# Patient Record
Sex: Female | Born: 1955 | Race: White | Hispanic: No | Marital: Married | Smoking: Never smoker
Health system: Southern US, Community
[De-identification: ages and names within clinical notes are randomized; demographics above are authoritative.]

## PROBLEM LIST (undated history)

## (undated) DIAGNOSIS — C801 Malignant (primary) neoplasm, unspecified: Secondary | ICD-10-CM

---

## 1991-02-23 HISTORY — PX: BREAST EXCISIONAL BIOPSY: SUR124

## 1997-07-23 ENCOUNTER — Other Ambulatory Visit: Admission: RE | Admit: 1997-07-23 | Discharge: 1997-07-23 | Payer: Self-pay | Admitting: *Deleted

## 2001-07-07 ENCOUNTER — Encounter (INDEPENDENT_AMBULATORY_CARE_PROVIDER_SITE_OTHER): Payer: Self-pay | Admitting: *Deleted

## 2001-07-07 ENCOUNTER — Ambulatory Visit (HOSPITAL_COMMUNITY): Admission: RE | Admit: 2001-07-07 | Discharge: 2001-07-07 | Payer: Self-pay | Admitting: *Deleted

## 2004-05-15 ENCOUNTER — Ambulatory Visit: Payer: Self-pay | Admitting: Family Medicine

## 2005-05-17 ENCOUNTER — Ambulatory Visit: Payer: Self-pay | Admitting: Family Medicine

## 2006-03-11 ENCOUNTER — Ambulatory Visit: Payer: Self-pay | Admitting: Urology

## 2006-05-24 ENCOUNTER — Ambulatory Visit: Payer: Self-pay | Admitting: Family Medicine

## 2006-10-18 ENCOUNTER — Ambulatory Visit: Payer: Self-pay | Admitting: Gastroenterology

## 2007-05-30 ENCOUNTER — Ambulatory Visit: Payer: Self-pay | Admitting: Family Medicine

## 2008-06-05 ENCOUNTER — Ambulatory Visit: Payer: Self-pay | Admitting: Family Medicine

## 2008-07-11 DIAGNOSIS — F32A Depression, unspecified: Secondary | ICD-10-CM | POA: Insufficient documentation

## 2008-08-07 DIAGNOSIS — E559 Vitamin D deficiency, unspecified: Secondary | ICD-10-CM | POA: Insufficient documentation

## 2009-05-08 ENCOUNTER — Emergency Department (HOSPITAL_COMMUNITY): Admission: EM | Admit: 2009-05-08 | Discharge: 2009-05-08 | Payer: Self-pay | Admitting: Emergency Medicine

## 2009-06-09 ENCOUNTER — Ambulatory Visit: Payer: Self-pay | Admitting: Family Medicine

## 2009-09-22 ENCOUNTER — Ambulatory Visit: Payer: Self-pay | Admitting: Internal Medicine

## 2009-10-10 ENCOUNTER — Ambulatory Visit: Payer: Self-pay | Admitting: Internal Medicine

## 2009-10-23 ENCOUNTER — Ambulatory Visit: Payer: Self-pay | Admitting: Internal Medicine

## 2010-05-17 LAB — COMPREHENSIVE METABOLIC PANEL
Calcium: 9.3 mg/dL (ref 8.4–10.5)
Chloride: 103 mEq/L (ref 96–112)
GFR calc Af Amer: >60 mL/min (ref >60)
Potassium: 4.1 mEq/L (ref 3.5–5.1)

## 2010-05-17 LAB — POCT CARDIAC MARKERS
CKMB, poc: <1 ng/mL — ABNORMAL LOW (ref 1.0–8.0)
Myoglobin, poc: 71.3 ng/mL (ref 12–200)
Troponin i, poc: <0.05 ng/mL (ref 0.00–0.09)

## 2010-05-17 LAB — DIFFERENTIAL
Basophils Absolute: 0 K/uL (ref 0.0–0.1)
Basophils Relative: 0 % (ref 0–1)
Eosinophils Absolute: 0.1 10*3/uL (ref 0.0–0.7)
Eosinophils Relative: 2 % (ref 0–5)
Lymphocytes Relative: 43 % (ref 12–46)
Lymphs Abs: 2.1 K/uL (ref 0.7–4.0)
Monocytes Absolute: 0.3 10*3/uL (ref 0.1–1.0)
Monocytes Relative: 7 % (ref 3–12)
Neutro Abs: 2.3 10*3/uL (ref 1.7–7.7)
Neutrophils Relative %: 48 % (ref 43–77)

## 2010-05-17 LAB — CBC
HCT: 44 % (ref 36.0–46.0)
Hemoglobin: 14.6 g/dL (ref 12.0–15.0)
MCHC: 33.2 g/dL (ref 30.0–36.0)
MCV: 91.6 fL (ref 78.0–100.0)
Platelets: 214 K/uL (ref 150–400)
RBC: 4.8 MIL/uL (ref 3.87–5.11)
RDW: 12.4 % (ref 11.5–15.5)
WBC: 4.8 K/uL (ref 4.0–10.5)

## 2010-05-17 LAB — COMPREHENSIVE METABOLIC PANEL WITH GFR
ALT: 68 U/L — ABNORMAL HIGH (ref 0–35)
AST: 43 U/L — ABNORMAL HIGH (ref 0–37)
Albumin: 4 g/dL (ref 3.5–5.2)
Alkaline Phosphatase: 64 U/L (ref 39–117)
BUN: 12 mg/dL (ref 6–23)
CO2: 31 meq/L (ref 19–32)
Creatinine, Ser: 0.92 mg/dL (ref 0.4–1.2)
GFR calc non Af Amer: >60 mL/min (ref >60)
Glucose, Bld: 94 mg/dL (ref 70–99)
Sodium: 140 meq/L (ref 135–145)
Total Bilirubin: 1.3 mg/dL — ABNORMAL HIGH (ref 0.3–1.2)
Total Protein: 7.1 g/dL (ref 6.0–8.3)

## 2010-05-17 LAB — D-DIMER, QUANTITATIVE: D-Dimer, Quant: 0.43 ug{FEU}/mL (ref 0.00–0.48)

## 2010-06-11 ENCOUNTER — Ambulatory Visit: Payer: Self-pay | Admitting: Family Medicine

## 2010-07-10 NOTE — Op Note (Signed)
[facility]  Patient:    Jasmin Hester, Jasmin Hester Visit Number: [id number] MRN: [id number]          Service Type: DSU Location: DAY Attending Physician:  Marin Comment Dictated by:   Pershing Cox, M.D. Proc. Date: 07/07/01 Admit Date:  07/07/2001 Discharge Date: 07/07/2001                             Operative Report  PREOPERATIVE DIAGNOSES:  Postcoital vaginal bleeding and abnormal hydrosonogram.  POSTOPERATIVE DIAGNOSES: 1. Normal endometrial canal. 2. No filling defects. 3. Lush endometrium.  ANESTHESIA:  MAC plus Marcaine paracervical block.  SURGEON:  Pershing Cox, M.D.  INDICATIONS FOR PROCEDURE:  Zoi Devine is a 55 year old, gravida 2, para 2, female, who has been having heavy menstrual periods with clots that occur especially the first day of her menstrual period.  She has also had two episodes of postcoital bleeding which led to a hydrosonogram.  This hydrosonogram showed thickened anterior and posterior endometrial walls with a question of hyperechoic mass on the posterior wall.  The patient is brought to the operating room today for evaluation of her endometrium and possible resection.  OPERATIVE FINDINGS:  Uterine cavity sounded to 8.5 cm.  The uterus was small, anteflexed, and there were no adnexal masses.  The endometrial cavity showed lush endometrium covering all of the endometrial surfaces.  No atypical vascularity or ostia were visualized.  At the end of the procedure, the cavity was also visualized and photographed.  There was no suggestion of submucosal myoma or polyp.  DESCRIPTION OF PROCEDURE:  Devan Babino was brought to the operating room with an IV in place.  Because of penicillin allergy, she received 400 mg of ciprofloxacin for preoperative antibiotic.  She was placed supine on the OR table, and IV sedation was administered.  She was then placed into Allen stirrups, and exam under anesthesia was  performed.  Hibiclens was used to cleanse the vagina, perineum, and upper thighs.  Sterile linens were used to drape for a vaginal procedure.  This included a collecting drape beneath her buttocks for our effluent.  Weight vaginal speculum was inserted into the vagina.  The cervix was injected on the anterior surface with 0.25% Marcaine.  It was then grasped with a single-tooth tenaculum and Marcaine paracervical block was administered at the 3, 4, 7, and 8 position.  Kevorkian curette was used to obtain endocervical curettings.  The uterine sound then passed to the depth of the fundus approximately 8.5 cm.  Serial Pratt dilators were used to dilate the cervix to size 25.  Diagnostic scope was introduced and using through-and-through irrigation with a pressure setting of 100 and sorbitol irrigation, we were able to visualize the endometrial cavity, and photographs were taken to document our findings.  The hysteroscope was removed, and the cervix was dilated to size 29 Pratt dilator to admit a small sharp curette.  The walls of the endometrium were vigorously curetted, and all of the collections were submitted as endometrial curettings.  Polyp forceps were introduced to obtain any other material that was able to be retrieved.  Hysteroscope was then reintroduced into the cavity, and the cavity was distended again and visualized.  There was no evidence of submucosal myoma or uterine polyp.  The patient was taken to the recovery room in excellent condition. Dictated by:   Pershing Cox, M.D. Attending Physician:  Marin Comment DD:  07/07/01 TD:  07/09/01 Job: [id number]

## 2010-07-23 ENCOUNTER — Ambulatory Visit: Payer: Self-pay

## 2010-07-30 ENCOUNTER — Ambulatory Visit: Payer: Self-pay

## 2011-06-21 ENCOUNTER — Ambulatory Visit: Payer: Self-pay | Admitting: Family Medicine

## 2012-06-22 ENCOUNTER — Ambulatory Visit: Payer: Self-pay | Admitting: Family Medicine

## 2013-06-25 ENCOUNTER — Ambulatory Visit: Payer: Self-pay | Admitting: Family Medicine

## 2013-10-15 LAB — HEMOGLOBIN A1C: HEMOGLOBIN A1C: 5.4

## 2013-12-05 ENCOUNTER — Ambulatory Visit: Payer: Self-pay | Admitting: Family Medicine

## 2013-12-05 LAB — HEPATIC FUNCTION PANEL
ALT: 15 U/L (ref 7–35)
AST: 19 U/L (ref 13–35)

## 2013-12-05 LAB — CBC AND DIFFERENTIAL
HCT: 39 % (ref 36–46)
Hemoglobin: 13.4 g/dL (ref 12.0–16.0)
Platelets: 215 10*3/uL (ref 150–399)
WBC: 8.4 10*3/mL

## 2013-12-05 LAB — BASIC METABOLIC PANEL
BUN: 11 mg/dL (ref 4–21)
Creatinine: 0.8 mg/dL (ref 0.5–1.1)
POTASSIUM: 4.3 mmol/L (ref 3.4–5.3)
SODIUM: 141 mmol/L (ref 137–147)

## 2013-12-27 ENCOUNTER — Ambulatory Visit: Payer: Self-pay | Admitting: Gastroenterology

## 2013-12-27 LAB — HM COLONOSCOPY: HM COLON: NORMAL

## 2014-01-23 LAB — HM PAP SMEAR: HM PAP: NEGATIVE

## 2014-06-10 ENCOUNTER — Other Ambulatory Visit: Payer: Self-pay | Admitting: Family Medicine

## 2014-06-10 DIAGNOSIS — Z1231 Encounter for screening mammogram for malignant neoplasm of breast: Secondary | ICD-10-CM

## 2014-06-27 ENCOUNTER — Ambulatory Visit
Admission: RE | Admit: 2014-06-27 | Discharge: 2014-06-27 | Disposition: A | Discharge status: Home / Self Care | Payer: 59 | Source: Ambulatory Visit | Attending: Family Medicine | Admitting: Family Medicine

## 2014-06-27 DIAGNOSIS — Z1231 Encounter for screening mammogram for malignant neoplasm of breast: Secondary | ICD-10-CM | POA: Insufficient documentation

## 2014-06-27 HISTORY — DX: Malignant (primary) neoplasm, unspecified: C80.1

## 2014-06-27 LAB — HM MAMMOGRAPHY

## 2014-09-11 ENCOUNTER — Other Ambulatory Visit: Payer: Self-pay | Admitting: Family Medicine

## 2014-09-11 DIAGNOSIS — Z79890 Hormone replacement therapy (postmenopausal): Secondary | ICD-10-CM

## 2014-09-12 DIAGNOSIS — Z79890 Hormone replacement therapy (postmenopausal): Secondary | ICD-10-CM | POA: Insufficient documentation

## 2014-09-12 NOTE — Telephone Encounter (Signed)
Last OV 01/2014  Thanks,   -Mickel Baas

## 2014-12-09 ENCOUNTER — Encounter: Payer: Self-pay | Admitting: Family Medicine

## 2014-12-09 ENCOUNTER — Ambulatory Visit (INDEPENDENT_AMBULATORY_CARE_PROVIDER_SITE_OTHER): Payer: 59 | Admitting: Family Medicine

## 2014-12-09 VITALS — BP 108/82 | HR 68 | Temp 98.4°F | Resp 16 | Ht 63.0 in | Wt 127.0 lb

## 2014-12-09 DIAGNOSIS — M1990 Unspecified osteoarthritis, unspecified site: Secondary | ICD-10-CM | POA: Insufficient documentation

## 2014-12-09 DIAGNOSIS — J069 Acute upper respiratory infection, unspecified: Secondary | ICD-10-CM

## 2014-12-09 DIAGNOSIS — R011 Cardiac murmur, unspecified: Secondary | ICD-10-CM | POA: Insufficient documentation

## 2014-12-09 DIAGNOSIS — D709 Neutropenia, unspecified: Secondary | ICD-10-CM | POA: Insufficient documentation

## 2014-12-09 DIAGNOSIS — N951 Menopausal and female climacteric states: Secondary | ICD-10-CM | POA: Insufficient documentation

## 2014-12-09 DIAGNOSIS — Z87898 Personal history of other specified conditions: Secondary | ICD-10-CM | POA: Insufficient documentation

## 2014-12-09 DIAGNOSIS — N393 Stress incontinence (female) (male): Secondary | ICD-10-CM | POA: Insufficient documentation

## 2014-12-09 MED ORDER — BENZONATATE 100 MG PO CAPS: 100.0000 mg | ORAL_CAPSULE | Freq: Three times a day (TID) | ORAL | Status: DC | PRN

## 2014-12-09 NOTE — Progress Notes (Signed)
Subjective:    Patient ID: Jasmin Hester, female    DOB: 21-May-1955, 59 y.o.   MRN: [id number]  Cough This is a new problem. The current episode started 1 to 4 weeks ago (x 1 week). The problem has been gradually improving. The cough is non-productive. Associated symptoms include chest pain, chills, ear congestion (right), headaches and nasal congestion. Pertinent negatives include no ear pain, fever, heartburn, hemoptysis, myalgias, postnasal drip, rash, rhinorrhea, sore throat, shortness of breath, sweats, weight loss or wheezing. She has tried OTC cough suppressant (Mucinex) for the symptoms. The treatment provided mild relief. Her past medical history is significant for pneumonia (30 years ago per pt). There is no history of asthma or COPD.      Review of Systems  Constitutional: Positive for chills. Negative for fever and weight loss.  HENT: Negative for ear pain, postnasal drip, rhinorrhea and sore throat.   Respiratory: Positive for cough. Negative for hemoptysis, shortness of breath and wheezing.   Cardiovascular: Positive for chest pain.  Gastrointestinal: Negative for heartburn.  Musculoskeletal: Negative for myalgias.  Skin: Negative for rash.  Neurological: Positive for headaches.   BP 108/82 mmHg  Pulse 68  Temp(Src) 98.4 F (36.9 C) (Oral)  Resp 16  Ht 5\' 3"  (1.6 m)  Wt 127 lb (57.607 kg)  BMI 22.50 kg/m2  SpO2 97%   Patient Active Problem List   Diagnosis Date Noted  . Arthritis 12/09/2014  . Cardiac murmur 12/09/2014  . H/O disease 12/09/2014  . Neutropenia ([place]) 12/09/2014  . Female climacteric state 12/09/2014  . Stress incontinence 12/09/2014  . Postmenopausal HRT (hormone replacement therapy) 09/12/2014  . Abnormal LFTs 05/12/2009  . Avitaminosis D 08/07/2008  . Idiopathic scoliosis 07/23/2008  . Clinical depression 07/11/2008  . H/O Malignant melanoma 07/11/2008  . Restless leg 07/11/2008   Past Medical History  Diagnosis Date  . Cancer [facility])      melanoma 2007   Current Outpatient Prescriptions on File Prior to Visit  Medication Sig  . estradiol (ESTRACE) 0.5 MG tablet Take 1 tablet by mouth  every day   No current facility-administered medications on file prior to visit.   Allergies  Allergen Reactions  . Penicillins    Past Surgical History  Procedure Laterality Date  . Breast biopsy Left     twice negative results  . Breast biopsy Right     one neg   Social History   Social History  . Marital Status: Married    Spouse Name: N/A  . Number of Children: N/A  . Years of Education: N/A   Occupational History  . Not on file.   Social History Main Topics  . Smoking status: Never Smoker   . Smokeless tobacco: Never Used  . Alcohol Use: Yes     Comment: occasional  . Drug Use: No  . Sexual Activity: Not on file   Other Topics Concern  . Not on file   Social History Narrative   Family History  Problem Relation Age of Onset  . Breast cancer Maternal Aunt 40    3 aunts      .result     Objective:   Physical Exam  Constitutional: She appears well-developed and well-nourished.  HENT:  Head: Normocephalic and atraumatic.  Right Ear: External ear normal.  Left Ear: External ear normal.  Nose: Nose normal.  Mouth/Throat: Oropharynx is clear and moist.  Cardiovascular: Normal rate and regular rhythm.   Pulmonary/Chest: Effort normal and breath sounds normal.  Psychiatric: She has a normal mood and affect. Her behavior is normal.          Assessment & Plan:  1. Upper respiratory infection Advised pt that this is viral; call if sx worsen for abx. Take Tessalon Perles as below for sx. Continue OTC Mucinex. - benzonatate (TESSALON) 100 MG capsule; Take 1 capsule (100 mg total) by mouth 3 (three) times daily as needed for cough.  Dispense: 30 capsule; Refill: 0  Patient seen and examined by Jerrell Belfast, MD, and note scribed by Renaldo Fiddler, CMA. I have reviewed the document for accuracy and  completeness and I agree with above. Jerrell Belfast, MD   Margarita Rana, MD

## 2014-12-09 NOTE — Patient Instructions (Signed)
If your symptoms worsen, call office at [phone] to request antibiotics. Try lemon and honey at home for cough. Take Mucinex and Tessalon Perles as prescribed.

## 2015-02-18 ENCOUNTER — Encounter: Payer: Self-pay | Admitting: Family Medicine

## 2015-02-18 ENCOUNTER — Ambulatory Visit (INDEPENDENT_AMBULATORY_CARE_PROVIDER_SITE_OTHER): Payer: 59 | Admitting: Family Medicine

## 2015-02-18 VITALS — BP 108/72 | HR 60 | Temp 97.6°F | Resp 16 | Ht 63.0 in | Wt 126.0 lb

## 2015-02-18 DIAGNOSIS — Z126 Encounter for screening for malignant neoplasm of bladder: Secondary | ICD-10-CM

## 2015-02-18 DIAGNOSIS — R42 Dizziness and giddiness: Secondary | ICD-10-CM

## 2015-02-18 DIAGNOSIS — E559 Vitamin D deficiency, unspecified: Secondary | ICD-10-CM | POA: Diagnosis not present

## 2015-02-18 DIAGNOSIS — Z0000 Encounter for general adult medical examination without abnormal findings: Secondary | ICD-10-CM

## 2015-02-18 LAB — POCT URINALYSIS DIPSTICK
Bilirubin, UA: NEGATIVE
Blood, UA: NEGATIVE
Glucose, UA: NEGATIVE
KETONES UA: NEGATIVE
LEUKOCYTES UA: NEGATIVE
NITRITE UA: NEGATIVE
Protein, UA: NEGATIVE
Spec Grav, UA: <=1.005
Urobilinogen, UA: 0.2
pH, UA: 6

## 2015-02-18 NOTE — Progress Notes (Signed)
Patient: Jasmin Hester, Female    DOB: August 02, 1955, 59 y.o.   MRN: [id number] Visit Date: 02/18/2015  Today's Provider: Margarita Rana, MD   Chief Complaint  Patient presents with  . Annual Exam   Subjective:    Annual physical exam Jasmin Hester is a 59 y.o. female who presents today for health maintenance and complete physical. She feels well. She reports exercising on the treadmill x 30 minutes three days a week. She reports she is sleeping well.  Last CPE- 01/23/2014 Last Mammo- 06/27/2014- BI-RADS 1 Last Pap- 01/23/2014. Negative, HPV negative Last Colonoscopy- 12/27/2013- WNL -----------------------------------------------------------------   Review of Systems  Genitourinary: Positive for urgency and enuresis.  Musculoskeletal: Positive for back pain.  Neurological: Positive for dizziness.  Hematological: Bruises/bleeds easily.    Social History      She  reports that she has never smoked. She has never used smokeless tobacco. She reports that she drinks alcohol. She reports that she does not use illicit drugs.       Social History   Social History  . Marital Status: Married    Spouse Name: Herbie [place]  . Number of Children: 2  . Years of Education: Bachelor's   Social History Main Topics  . Smoking status: Never Smoker   . Smokeless tobacco: Never Used  . Alcohol Use: Yes     Comment: occasional  . Drug Use: No  . Sexual Activity: Not Asked   Other Topics Concern  . None   Social History Narrative    Patient Active Problem List   Diagnosis Date Noted  . Arthritis 12/09/2014  . Cardiac murmur 12/09/2014  . H/O disease 12/09/2014  . Neutropenia ([place]) 12/09/2014  . Female climacteric state 12/09/2014  . Stress incontinence 12/09/2014  . Postmenopausal HRT (hormone replacement therapy) 09/12/2014  . Abnormal LFTs 05/12/2009  . Avitaminosis D 08/07/2008  . Idiopathic scoliosis 07/23/2008  . Clinical depression 07/11/2008  . H/O Malignant  melanoma 07/11/2008  . Restless leg 07/11/2008    Past Surgical History  Procedure Laterality Date  . Breast biopsy Left     twice negative results  . Breast biopsy Right     one neg    Family History        Family Status  Relation Status Death Age  . Maternal Aunt Alive   . Mother Alive   . Father Deceased 57  . Brother Deceased   . Daughter Alive   . Son Alive   . Brother Alive         Her family history includes Arthritis in her mother; Asthma in her brother; Breast cancer (age of onset: 23) in her maternal aunt; CAD in her mother; Cancer in her brother and maternal aunt; Diabetes in her father; Osteoporosis in her mother; Parkinson's disease in her father.    Allergies  Allergen Reactions  . Penicillins     Previous Medications   CHOLECALCIFEROL (VITAMIN D) 1000 UNITS TABLET    Take 1 tablet by mouth daily.   DOXYLAMINE SUCCINATE, SLEEP, (SLEEP AID PO)    Take by mouth.   ESTRADIOL (ESTRACE) 0.5 MG TABLET    Take 1 tablet by mouth  every day   MULTIPLE VITAMIN PO    Take 1 tablet by mouth daily.   TOLTERODINE (DETROL LA) 4 MG 24 HR CAPSULE    Take 1 capsule by mouth daily.    Patient Care Team: Margarita Rana, MD as PCP - General (Family  Medicine)     Objective:   Vitals: BP 108/72 mmHg  Pulse 60  Temp(Src) 97.6 F (36.4 C) (Oral)  Resp 16  Ht 5\' 3"  (1.6 m)  Wt 126 lb (57.153 kg)  BMI 22.33 kg/m2   Physical Exam  Constitutional: She is oriented to person, place, and time. She appears well-developed and well-nourished.  HENT:  Head: Normocephalic and atraumatic.  Right Ear: Tympanic membrane, external ear and ear canal normal.  Left Ear: Tympanic membrane, external ear and ear canal normal.  Nose: Nose normal.  Mouth/Throat: Uvula is midline, oropharynx is clear and moist and mucous membranes are normal.  Eyes: Conjunctivae, EOM and lids are normal. Pupils are equal, round, and reactive to light. Right eye exhibits normal extraocular motion and no  nystagmus. Left eye exhibits normal extraocular motion and no nystagmus.  Neck: Trachea normal and normal range of motion. Neck supple. Carotid bruit is not present. No thyroid mass and no thyromegaly present.  Cardiovascular: Normal rate, regular rhythm and normal heart sounds.   Pulmonary/Chest: Effort normal and breath sounds normal.  Abdominal: Soft. Normal appearance and bowel sounds are normal. There is no hepatosplenomegaly. There is no tenderness.  Genitourinary: No breast swelling, tenderness or discharge.  Musculoskeletal: Normal range of motion.  Lymphadenopathy:    She has no cervical adenopathy.    She has no axillary adenopathy.  Neurological: She is alert and oriented to person, place, and time. She has normal strength. No cranial nerve deficit.  Skin: Skin is warm, dry and intact.  Psychiatric: She has a normal mood and affect. Her speech is normal and behavior is normal. Judgment and thought content normal. Cognition and memory are normal.     Depression Screen PHQ 2/9 Scores 02/18/2015  PHQ - 2 Score 0      Assessment & Plan:     Routine Health Maintenance and Physical Exam  Exercise Activities and Dietary recommendations Goals    None      Immunization History  Administered Date(s) Administered  . Tdap 04/22/2006    Health Maintenance  Topic Date Due  . Hepatitis C Screening  09/06/1955  . HIV Screening  07/21/1970  . INFLUENZA VACCINE  11/23/2015 (Originally 09/23/2014)  . TETANUS/TDAP  04/21/2016  . MAMMOGRAM  06/26/2016  . PAP SMEAR  01/23/2017  . COLONOSCOPY  12/28/2023      Discussed health benefits of physical activity, and encouraged her to engage in regular exercise appropriate for her age and condition.   1. Annual physical exam As above.   2. Bladder cancer screening Stable.  - POCT urinalysis dipstick Results for orders placed or performed in visit on 02/18/15  POCT urinalysis dipstick  Result Value Ref Range   Color, UA straw     Clarity, UA clear    Glucose, UA Negative    Bilirubin, UA Negative    Ketones, UA Negative    Spec Grav, UA <=1.005    Blood, UA Negative    pH, UA 6.0    Protein, UA Negative    Urobilinogen, UA 0.2    Nitrite, UA Negative    Leukocytes, UA Negative Negative    3. Dizziness New problem. Will check labs.  - CBC with Differential/Platelet - Comprehensive metabolic panel - TSH  4. Avitaminosis D - VITAMIN D 25 Hydroxy (Vit-D Deficiency, Fractures)   Patient was seen and examined by Jerrell Belfast, MD, and note scribed by Renaldo Fiddler, CMA. I have reviewed the document for accuracy and completeness  and I agree with above. Jerrell Belfast, MD   Margarita Rana, MD    --------------------------------------------------------------------

## 2015-02-19 LAB — CBC WITH DIFFERENTIAL/PLATELET
BASOS ABS: 0 10*3/uL (ref 0.0–0.2)
BASOS: 0 %
EOS (ABSOLUTE): 0 10*3/uL (ref 0.0–0.4)
EOS: 1 %
Hematocrit: 42.4 % (ref 34.0–46.6)
Hemoglobin: 14.2 g/dL (ref 11.1–15.9)
IMMATURE GRANS (ABS): 0 10*3/uL (ref 0.0–0.1)
IMMATURE GRANULOCYTES: 0 %
LYMPHS: 36 %
Lymphocytes Absolute: 2 10*3/uL (ref 0.7–3.1)
MCH: 29.6 pg (ref 26.6–33.0)
MCHC: 33.5 g/dL (ref 31.5–35.7)
MCV: 88 fL (ref 79–97)
Monocytes Absolute: 0.5 10*3/uL (ref 0.1–0.9)
Monocytes: 9 %
NEUTROS PCT: 54 %
Neutrophils Absolute: 3 10*3/uL (ref 1.4–7.0)
PLATELETS: 242 10*3/uL (ref 150–379)
RBC: 4.8 x10E6/uL (ref 3.77–5.28)
RDW: 12.9 % (ref 12.3–15.4)
WBC: 5.5 10*3/uL (ref 3.4–10.8)

## 2015-02-19 LAB — COMPREHENSIVE METABOLIC PANEL
ALT: 13 IU/L (ref 0–32)
AST: 20 IU/L (ref 0–40)
Albumin/Globulin Ratio: 1.7 (ref 1.1–2.5)
Albumin: 4.3 g/dL (ref 3.5–5.5)
Alkaline Phosphatase: 63 IU/L (ref 39–117)
BUN/Creatinine Ratio: 14 (ref 9–23)
BUN: 12 mg/dL (ref 6–24)
Bilirubin Total: 1.4 mg/dL — ABNORMAL HIGH (ref 0.0–1.2)
CALCIUM: 9.5 mg/dL (ref 8.7–10.2)
CO2: 26 mmol/L (ref 18–29)
CREATININE: 0.87 mg/dL (ref 0.57–1.00)
Chloride: 99 mmol/L (ref 96–106)
GFR, EST AFRICAN AMERICAN: 84 mL/min/{1.73_m2} (ref >59)
GFR, EST NON AFRICAN AMERICAN: 73 mL/min/{1.73_m2} (ref >59)
GLUCOSE: 76 mg/dL (ref 65–99)
Globulin, Total: 2.6 g/dL (ref 1.5–4.5)
Potassium: 4.3 mmol/L (ref 3.5–5.2)
Sodium: 142 mmol/L (ref 134–144)
TOTAL PROTEIN: 6.9 g/dL (ref 6.0–8.5)

## 2015-02-19 LAB — VITAMIN D 25 HYDROXY (VIT D DEFICIENCY, FRACTURES): Vit D, 25-Hydroxy: 38.2 ng/mL (ref 30.0–100.0)

## 2015-02-19 LAB — TSH: TSH: 1.29 u[IU]/mL (ref 0.450–4.500)

## 2015-02-19 NOTE — Progress Notes (Signed)
Patient advised as below. sd 

## 2015-04-12 ENCOUNTER — Other Ambulatory Visit: Payer: Self-pay | Admitting: Family Medicine

## 2015-04-12 DIAGNOSIS — N393 Stress incontinence (female) (male): Secondary | ICD-10-CM

## 2015-05-20 ENCOUNTER — Other Ambulatory Visit: Payer: Self-pay | Admitting: Family Medicine

## 2015-05-20 DIAGNOSIS — Z1231 Encounter for screening mammogram for malignant neoplasm of breast: Secondary | ICD-10-CM

## 2015-06-30 ENCOUNTER — Ambulatory Visit
Admission: RE | Admit: 2015-06-30 | Discharge: 2015-06-30 | Disposition: A | Discharge status: Home / Self Care | Payer: 59 | Source: Ambulatory Visit | Attending: Family Medicine | Admitting: Family Medicine

## 2015-06-30 DIAGNOSIS — Z1231 Encounter for screening mammogram for malignant neoplasm of breast: Secondary | ICD-10-CM | POA: Diagnosis not present

## 2015-10-18 ENCOUNTER — Other Ambulatory Visit: Payer: Self-pay | Admitting: Family Medicine

## 2015-10-18 DIAGNOSIS — Z79890 Hormone replacement therapy (postmenopausal): Secondary | ICD-10-CM

## 2015-10-30 ENCOUNTER — Other Ambulatory Visit: Payer: Self-pay | Admitting: Family Medicine

## 2015-10-30 DIAGNOSIS — N393 Stress incontinence (female) (male): Secondary | ICD-10-CM

## 2015-11-04 NOTE — Telephone Encounter (Signed)
Dr Alben Spittle patient, please review-aa

## 2015-12-11 ENCOUNTER — Encounter: Payer: Self-pay | Admitting: Family Medicine

## 2015-12-11 ENCOUNTER — Ambulatory Visit (INDEPENDENT_AMBULATORY_CARE_PROVIDER_SITE_OTHER): Payer: 59 | Admitting: Family Medicine

## 2015-12-11 VITALS — BP 104/62 | HR 64 | Temp 98.0°F | Resp 14 | Ht 62.25 in | Wt 127.0 lb

## 2015-12-11 DIAGNOSIS — Z124 Encounter for screening for malignant neoplasm of cervix: Secondary | ICD-10-CM

## 2015-12-11 DIAGNOSIS — Z0000 Encounter for general adult medical examination without abnormal findings: Secondary | ICD-10-CM | POA: Diagnosis not present

## 2015-12-11 DIAGNOSIS — Z1211 Encounter for screening for malignant neoplasm of colon: Secondary | ICD-10-CM | POA: Diagnosis not present

## 2015-12-11 LAB — POCT URINALYSIS DIPSTICK
BILIRUBIN UA: NEGATIVE
Blood, UA: NEGATIVE
Glucose, UA: NEGATIVE
KETONES UA: NEGATIVE
Nitrite, UA: NEGATIVE
PH UA: 5
Protein, UA: NEGATIVE
SPEC GRAV UA: 1.01
Urobilinogen, UA: 0.2

## 2015-12-11 LAB — IFOBT (OCCULT BLOOD): IFOBT: NEGATIVE

## 2015-12-11 NOTE — Progress Notes (Signed)
Patient: Jasmin Hester, Female    DOB: [birth year], 60 y.o.   MRN: [id number] Visit Date: 12/11/2015  Today's Provider: Wilhemena Durie, MD   Chief Complaint  Patient presents with  . Annual Exam   Subjective:  Jasmin Hester is a 60 y.o. female who presents today for health maintenance and complete physical. She feels well. She reports exercising about 4 to 5 times a week for about 30 minutes. She reports she is sleeping well.She drinks about 4 beers per week, no excessive consumption. Immunization History  Administered Date(s) Administered  . Tdap 04/22/2006   Last Mammogram 06/30/15 normal  Colonoscopy 12/27/13 normal repeat 10 years  Pap smear 01/23/14 normal and HPV negative.-she had hysterectomy but still has cervix. Depression screen [facility] 2/9 12/11/2015 02/18/2015  Decreased Interest 2 0  Down, Depressed, Hopeless 1 0  PHQ - 2 Score 3 0  Altered sleeping 0 -  Tired, decreased energy 2 -  Change in appetite 0 -  Feeling bad or failure about yourself  1 -  Trouble concentrating 1 -  Moving slowly or fidgety/restless 0 -  Suicidal thoughts 0 -  PHQ-9 Score 7 -  Difficult doing work/chores Somewhat difficult -    Review of Systems  Constitutional: Positive for fatigue.  HENT: Negative.   Eyes: Negative.   Respiratory: Negative.   Cardiovascular: Negative.   Gastrointestinal: Negative.   Endocrine: Negative.   Genitourinary: Positive for enuresis and urgency.  Musculoskeletal: Positive for arthralgias and back pain.  Skin: Negative.   Allergic/Immunologic: Negative.   Neurological: Negative.   Hematological: Negative.   Psychiatric/Behavioral: Positive for dysphoric mood. The patient is nervous/anxious.     Social History   Social History  . Marital status: Married    Spouse name: Herbie Baltimore  . Number of children: 2  . Years of education: Bachelor's   Occupational History  . Not on file.   Social History Main Topics  . Smoking status: Never Smoker  . Smokeless  tobacco: Never Used  . Alcohol use Yes     Comment: occasional  . Drug use: No  . Sexual activity: Not on file   Other Topics Concern  . Not on file   Social History Narrative  . No narrative on file    Patient Active Problem List   Diagnosis Date Noted  . Arthritis 12/09/2014  . Cardiac murmur 12/09/2014  . H/O disease 12/09/2014  . Neutropenia ([place]) 12/09/2014  . Female climacteric state 12/09/2014  . Stress incontinence 12/09/2014  . Postmenopausal HRT (hormone replacement therapy) 09/12/2014  . Abnormal LFTs 05/12/2009  . Avitaminosis D 08/07/2008  . Idiopathic scoliosis 07/23/2008  . Clinical depression 07/11/2008  . H/O Malignant melanoma 07/11/2008  . Restless leg 07/11/2008    Past Surgical History:  Procedure Laterality Date  . BREAST EXCISIONAL BIOPSY Left 1993    negative results  . BREAST EXCISIONAL BIOPSY Right [id number]    neg    Her family history includes Arthritis in her mother; Asthma in her brother; Breast cancer (age of onset: 37) in her maternal aunt; CAD in her mother; Cancer in her brother and maternal aunt; Diabetes in her father; Osteoporosis in her mother; Parkinson's disease in her father.    Outpatient Encounter Prescriptions as of 12/11/2015  Medication Sig Note  . estradiol (ESTRACE) 0.5 MG tablet Take 1 tablet by mouth  every day   . MULTIPLE VITAMIN PO Take 1 tablet by mouth daily. 12/09/2014: Received from: [place]:  Take by mouth.  . tolterodine (DETROL LA) 4 MG 24 hr capsule Take 1 capsule by mouth  daily   . [DISCONTINUED] cholecalciferol (VITAMIN D) 1000 UNITS tablet Take 1 tablet by mouth daily. 12/09/2014: Received from: [place]: Take by mouth.  . [DISCONTINUED] Doxylamine Succinate, Sleep, (SLEEP AID PO) Take by mouth.    No facility-administered encounter medications on file as of 12/11/2015.     Patient Care Team: Jerrol Banana., MD as PCP - General  (Family Medicine)     Objective:   Vitals:  Vitals:   12/11/15 0941  BP: 104/62  Pulse: 64  Resp: 14  Temp: 98 F (36.7 C)  Weight: 127 lb (57.6 kg)  Height: 5' 2.25" (1.581 m)    Physical Exam  Constitutional: She is oriented to person, place, and time. She appears well-developed and well-nourished.  HENT:  Head: Normocephalic and atraumatic.  Right Ear: External ear normal.  Left Ear: External ear normal.  Nose: Nose normal.  Mouth/Throat: Oropharynx is clear and moist.  Eyes: Conjunctivae are normal. No scleral icterus.  Neck: Neck supple. No thyromegaly present.  Cardiovascular: Normal rate, regular rhythm, normal heart sounds and intact distal pulses.   Pulmonary/Chest: Effort normal and breath sounds normal.  Abdominal: Soft.  Musculoskeletal: She exhibits no edema.  Lymphadenopathy:    She has no cervical adenopathy.  Neurological: She is alert and oriented to person, place, and time. No cranial nerve deficit. She exhibits normal muscle tone. Coordination normal.  Skin: Skin is warm and dry.  Fair skin and red hair.  Psychiatric: She has a normal mood and affect. Her behavior is normal. Judgment and thought content normal.     Depression Screen PHQ 2/9 Scores 02/18/2015  PHQ - 2 Score 0    PHQ9 is 9.  Assessment & Plan:    1. Annual physical exam - POCT urinalysis dipstick - CBC w/Diff/Platelet - Comprehensive metabolic panel - Lipid Panel With LDL/HDL Ratio - TSH Consider zoster. 2. Pap smear for cervical cancer screening - Pap IG and HPV (high risk) DNA detection  3. Colon cancer screening - IFOBT POC (occult bld, rslt in office) 4. Adjustment reaction Will re check in 6 months. May need to start depression medication. Sertraline if needed.  HP I, Exam and A&P transcribed under direction and in the presence of Miguel Aschoff, MD. I have done the exam and reviewed the chart and it is accurate to the best of my knowledge. Miguel Aschoff  M.D. [facility]

## 2015-12-17 LAB — PAP IG AND HPV HIGH-RISK
HPV, high-risk: NEGATIVE
PAP Smear Comment: 0

## 2015-12-18 ENCOUNTER — Telehealth: Payer: Self-pay

## 2015-12-18 NOTE — Telephone Encounter (Signed)
-----   Message from Jerrol Banana., MD sent at 12/18/2015  2:32 AM EDT ----- Pap normal.

## 2015-12-18 NOTE — Telephone Encounter (Signed)
LMTCB 12/18/2015  Thanks,   -Mickel Baas

## 2015-12-18 NOTE — Telephone Encounter (Signed)
Pt advised.   Thanks,   -[place]  

## 2015-12-22 ENCOUNTER — Telehealth: Payer: Self-pay

## 2015-12-22 NOTE — Telephone Encounter (Signed)
Patient is requesting to start sertraline. [facility]  [place] [phone]

## 2015-12-22 NOTE — Telephone Encounter (Signed)
Please review, LOV 12/11/15-aa

## 2015-12-23 MED ORDER — SERTRALINE HCL 50 MG PO TABS: 50.0000 mg | ORAL_TABLET | Freq: Every day | ORAL | 3 refills | Status: DC

## 2015-12-23 NOTE — Telephone Encounter (Signed)
Try sertraline 50 mg, start one half tablet daily. See [place] back in 1 month

## 2015-12-23 NOTE — Telephone Encounter (Signed)
Pt advised, RX sent in and app made-aa

## 2016-01-22 ENCOUNTER — Ambulatory Visit (INDEPENDENT_AMBULATORY_CARE_PROVIDER_SITE_OTHER): Payer: 59 | Admitting: Family Medicine

## 2016-01-22 ENCOUNTER — Encounter: Payer: Self-pay | Admitting: Family Medicine

## 2016-01-22 VITALS — BP 102/60 | HR 66 | Temp 97.8°F | Resp 16 | Wt 131.0 lb

## 2016-01-22 DIAGNOSIS — F329 Major depressive disorder, single episode, unspecified: Secondary | ICD-10-CM

## 2016-01-22 DIAGNOSIS — F32A Depression, unspecified: Secondary | ICD-10-CM

## 2016-01-22 NOTE — Progress Notes (Signed)
Subjective:  HPI Depression- Pt is here for a 1 month follow up of depression. She called in stating wanting Sertraline called in for her, it was called in and she was to follow up in 1 month. She reports that she is feeling better. She is unsure if this is causing her wake up some at night or if it is coincidence. She reports that she can go to sleep but she wakes up in the middle of the night. She reports that she is about 90% improved. Last PHQ-9 score was a 7 and is a 2 today.    Prior to Admission medications   Medication Sig Start Date End Date Taking? Authorizing Provider  estradiol (ESTRACE) 0.5 MG tablet Take 1 tablet by mouth  every day 10/21/15   Jerrol Banana., MD  MULTIPLE VITAMIN PO Take 1 tablet by mouth daily.    Historical Provider, MD  sertraline (ZOLOFT) 50 MG tablet Take 1 tablet (50 mg total) by mouth daily. 12/23/15   Richard Maceo Pro., MD  tolterodine (DETROL LA) 4 MG 24 hr capsule Take 1 capsule by mouth  daily 11/04/15   Margo Common, PA    Patient Active Problem List   Diagnosis Date Noted  . Arthritis 12/09/2014  . Cardiac murmur 12/09/2014  . H/O disease 12/09/2014  . Neutropenia ([place]) 12/09/2014  . Female climacteric state 12/09/2014  . Stress incontinence 12/09/2014  . Postmenopausal HRT (hormone replacement therapy) 09/12/2014  . Abnormal LFTs 05/12/2009  . Avitaminosis D 08/07/2008  . Idiopathic scoliosis 07/23/2008  . Clinical depression 07/11/2008  . H/O Malignant melanoma 07/11/2008  . Restless leg 07/11/2008    Past Medical History:  Diagnosis Date  . Cancer [facility])    melanoma 2007    Social History   Social History  . Marital status: Married    Spouse name: Herbie [place]  . Number of children: 2  . Years of education: Bachelor's   Occupational History  . Not on file.   Social History Main Topics  . Smoking status: Never Smoker  . Smokeless tobacco: Never Used  . Alcohol use Yes     Comment: occasional  . Drug use: No    . Sexual activity: Not on file   Other Topics Concern  . Not on file   Social History Narrative  . No narrative on file    Allergies  Allergen Reactions  . Penicillins     Review of Systems  Constitutional: Negative.   HENT: Negative.   Eyes: Negative.   Respiratory: Negative.   Cardiovascular: Negative.   Gastrointestinal: Negative.   Genitourinary: Negative.   Musculoskeletal: Negative.   Skin: Negative.   Neurological: Negative.   Endo/Heme/Allergies: Negative.   Psychiatric/Behavioral: Negative for depression. The patient is not nervous/anxious.     Immunization History  Administered Date(s) Administered  . Tdap 04/22/2006    Objective:  BP 102/60 (BP Location: Left Arm, Patient Position: Sitting, Cuff Size: Normal)   Pulse 66   Temp 97.8 F (36.6 C) (Oral)   Resp 16   Wt 131 lb (59.4 kg)   BMI 23.77 kg/m   Depression screen [facility] 2/9 01/22/2016 12/11/2015 02/18/2015  Decreased Interest 0 2 0  Down, Depressed, Hopeless 1 1 0  PHQ - 2 Score 1 3 0  Altered sleeping 1 0 -  Tired, decreased energy 0 2 -  Change in appetite 0 0 -  Feeling bad or failure about yourself  0 1 -  Trouble concentrating 0 1 -  Moving slowly or fidgety/restless 0 0 -  Suicidal thoughts 0 0 -  PHQ-9 Score 2 7 -  Difficult doing work/chores Not difficult at all Somewhat difficult -    Physical Exam  Constitutional: She is oriented to person, place, and time and well-developed, well-nourished, and in no distress.  HENT:  Head: Normocephalic and atraumatic.  Right Ear: External ear normal.  Left Ear: External ear normal.  Nose: Nose normal.  Mouth/Throat: Oropharynx is clear and moist.  Eyes: Conjunctivae and EOM are normal. Pupils are equal, round, and reactive to light.  Neck: Normal range of motion. Neck supple.  Cardiovascular: Normal rate, regular rhythm, normal heart sounds and intact distal pulses.   Pulmonary/Chest: Effort normal and breath sounds normal.  Abdominal:  Soft.  Musculoskeletal: Normal range of motion.  Neurological: She is alert and oriented to person, place, and time. She has normal reflexes. Gait normal. GCS score is 15.  Skin: Skin is warm and dry.  Psychiatric: Mood, memory, affect and judgment normal.    Lab Results  Component Value Date   WBC 5.5 02/18/2015   HGB 13.4 12/05/2013   HCT 42.4 02/18/2015   PLT 242 02/18/2015   GLUCOSE 76 02/18/2015   TSH 1.290 02/18/2015   HGBA1C 5.4 10/15/2013    CMP     Component Value Date/Time   NA 142 02/18/2015 1217   K 4.3 02/18/2015 1217   CL 99 02/18/2015 1217   CO2 26 02/18/2015 1217   GLUCOSE 76 02/18/2015 1217   GLUCOSE 94 05/08/2009 2109   BUN 12 02/18/2015 1217   CREATININE 0.87 02/18/2015 1217   CALCIUM 9.5 02/18/2015 1217   PROT 6.9 02/18/2015 1217   ALBUMIN 4.3 02/18/2015 1217   AST 20 02/18/2015 1217   ALT 13 02/18/2015 1217   ALKPHOS 63 02/18/2015 1217   BILITOT 1.4 (H) 02/18/2015 1217   GFRNONAA 73 02/18/2015 1217   GFRAA 84 02/18/2015 1217    Assessment and Plan :  1. Depression, unspecified depression type Pt is much improved. PHQ-9 today is 2. Continue Sertraline. May consider going off medication in about a year and a half at earliest. 2.Postmenopausal HRT 3.h/ Melanoma   HPI, Exam, and A&P Transcribed under the direction and in the presence of Richard L. Cranford Mon, MD  Electronically Signed: Webb Laws, CMA I have done the exam and reviewed the above chart and it is accurate to the best of my knowledge. Development worker, community has been used in this note in any air is in the dictation or transcription are unintentional.  [facility] 01/22/2016 9:48 AM

## 2016-01-28 ENCOUNTER — Other Ambulatory Visit: Payer: Self-pay | Admitting: Family Medicine

## 2016-01-28 ENCOUNTER — Other Ambulatory Visit: Payer: Self-pay

## 2016-01-28 DIAGNOSIS — Z79890 Hormone replacement therapy (postmenopausal): Secondary | ICD-10-CM

## 2016-01-28 MED ORDER — SERTRALINE HCL 50 MG PO TABS: 50.0000 mg | ORAL_TABLET | Freq: Every day | ORAL | 3 refills | Status: DC

## 2016-03-11 ENCOUNTER — Inpatient Hospital Stay: Payer: 59 | Admitting: Family Medicine

## 2016-04-17 ENCOUNTER — Other Ambulatory Visit: Payer: Self-pay | Admitting: Family Medicine

## 2016-04-17 DIAGNOSIS — N393 Stress incontinence (female) (male): Secondary | ICD-10-CM

## 2016-06-10 ENCOUNTER — Other Ambulatory Visit: Payer: Self-pay | Admitting: Family Medicine

## 2016-06-10 DIAGNOSIS — Z1231 Encounter for screening mammogram for malignant neoplasm of breast: Secondary | ICD-10-CM

## 2016-06-30 ENCOUNTER — Ambulatory Visit
Admission: RE | Admit: 2016-06-30 | Discharge: 2016-06-30 | Disposition: A | Discharge status: Home / Self Care | Payer: 59 | Source: Ambulatory Visit | Attending: Family Medicine | Admitting: Family Medicine

## 2016-06-30 DIAGNOSIS — Z1231 Encounter for screening mammogram for malignant neoplasm of breast: Secondary | ICD-10-CM | POA: Insufficient documentation

## 2016-07-21 ENCOUNTER — Encounter: Payer: Self-pay | Admitting: Family Medicine

## 2016-07-21 ENCOUNTER — Ambulatory Visit (INDEPENDENT_AMBULATORY_CARE_PROVIDER_SITE_OTHER): Payer: 59 | Admitting: Family Medicine

## 2016-07-21 VITALS — BP 108/62 | HR 62 | Temp 98.1°F | Resp 12 | Wt 130.0 lb

## 2016-07-21 DIAGNOSIS — E7849 Other hyperlipidemia: Secondary | ICD-10-CM

## 2016-07-21 DIAGNOSIS — E784 Other hyperlipidemia: Secondary | ICD-10-CM

## 2016-07-21 DIAGNOSIS — Z0000 Encounter for general adult medical examination without abnormal findings: Secondary | ICD-10-CM | POA: Diagnosis not present

## 2016-07-21 DIAGNOSIS — F3289 Other specified depressive episodes: Secondary | ICD-10-CM

## 2016-07-21 NOTE — Progress Notes (Signed)
Jasmin Hester  MRN: [id number] DOB: 1956/01/11  Subjective:  HPI  Patient is here for 6 months follow up. Depression: patient is taking Sertraline 50 mg. Feels like she is doing ok on this dose, she is having some increased stress with daughters wedding coming up.  She has gotten a number to see a Social worker. Depression screen [facility] 2/9 01/22/2016 12/11/2015 02/18/2015  Decreased Interest 0 2 0  Down, Depressed, Hopeless 1 1 0  PHQ - 2 Score 1 3 0  Altered sleeping 1 0 -  Tired, decreased energy 0 2 -  Change in appetite 0 0 -  Feeling bad or failure about yourself  0 1 -  Trouble concentrating 0 1 -  Moving slowly or fidgety/restless 0 0 -  Suicidal thoughts 0 0 -  PHQ-9 Score 2 7 -  Difficult doing work/chores Not difficult at all Somewhat difficult -    Patient Active Problem List   Diagnosis Date Noted  . Arthritis 12/09/2014  . Cardiac murmur 12/09/2014  . H/O disease 12/09/2014  . Neutropenia ([place]) 12/09/2014  . Female climacteric state 12/09/2014  . Stress incontinence 12/09/2014  . Postmenopausal HRT (hormone replacement therapy) 09/12/2014  . Abnormal LFTs 05/12/2009  . Avitaminosis D 08/07/2008  . Idiopathic scoliosis 07/23/2008  . Clinical depression 07/11/2008  . H/O Malignant melanoma 07/11/2008  . Restless leg 07/11/2008    Past Medical History:  Diagnosis Date  . Cancer [facility])    melanoma 2007    Social History   Social History  . Marital status: Married    Spouse name: Herbie [place]  . Number of children: 2  . Years of education: Bachelor's   Occupational History  . Not on file.   Social History Main Topics  . Smoking status: Never Smoker  . Smokeless tobacco: Never Used  . Alcohol use Yes     Comment: occasional  . Drug use: No  . Sexual activity: Not on file   Other Topics Concern  . Not on file   Social History Narrative  . No narrative on file    Outpatient Encounter Prescriptions as of 07/21/2016  Medication Sig Note  . estradiol  (ESTRACE) 0.5 MG tablet TAKE 1 TABLET BY MOUTH  EVERY DAY   . sertraline (ZOLOFT) 50 MG tablet Take 1 tablet (50 mg total) by mouth daily.   Marland Kitchen tolterodine (DETROL LA) 4 MG 24 hr capsule TAKE 1 CAPSULE BY MOUTH  DAILY   . [DISCONTINUED] MULTIPLE VITAMIN PO Take 1 tablet by mouth daily. 12/09/2014: Received from: [place]: Take by mouth.   No facility-administered encounter medications on file as of 07/21/2016.     Allergies  Allergen Reactions  . Penicillins     Review of Systems  Constitutional: Negative.   Respiratory: Negative.   Cardiovascular: Negative.   Musculoskeletal: Negative.   Psychiatric/Behavioral: The patient has insomnia.        Feels overwhelmed.    Objective:  BP 108/62   Pulse 62   Temp 98.1 F (36.7 C)   Resp 12   Wt 130 lb (59 kg)   BMI 23.59 kg/m   Physical Exam  Constitutional: She is oriented to person, place, and time and well-developed, well-nourished, and in no distress.  HENT:  Head: Normocephalic and atraumatic.  Eyes: Conjunctivae are normal. Pupils are equal, round, and reactive to light.  Neck: Normal range of motion. Neck supple.  Cardiovascular: Normal rate, regular rhythm, normal heart sounds and intact distal  pulses.  Exam reveals no gallop.   No murmur heard. Pulmonary/Chest: Effort normal and breath sounds normal. No respiratory distress. She has no wheezes.  Musculoskeletal: She exhibits no edema or tenderness.  Neurological: She is alert and oriented to person, place, and time. Gait normal.  Psychiatric: Mood, memory, affect and judgment normal.    Assessment and Plan :  1. Other depression Stable at this time. Will not make changes to the medication at this time.  2. Annual physical exam Using this code to re order labs from October 2017 CPE she had but did not get labs done. - CBC with Differential/Platelet - Comprehensive metabolic panel - Lipid Panel With LDL/HDL Ratio - TSH  3. Other  hyperlipidemia - Comprehensive metabolic panel - Lipid Panel With LDL/HDL Ratio  HPI, Exam and A&P transcribed by Theressa Millard, RMA under direction and in the presence of Miguel Aschoff, MD. I have done the exam and reviewed the chart and it is accurate to the best of my knowledge. Development worker, community has been used and  any errors in dictation or transcription are unintentional. Miguel Aschoff M.D. [facility]

## 2016-10-15 LAB — LIPID PANEL
Cholesterol: 231 — AB (ref 0–200)
HDL: 96 — AB (ref 35–70)
LDL CALC: 118
LDl/HDL Ratio: 2.4
Triglycerides: 83 (ref 40–160)

## 2016-10-15 LAB — BASIC METABOLIC PANEL
Creatinine: 1 (ref 0.5–1.1)
Glucose: 79

## 2016-10-15 LAB — HEMOGLOBIN A1C: HEMOGLOBIN A1C: 5.2

## 2016-12-06 ENCOUNTER — Other Ambulatory Visit: Payer: Self-pay | Admitting: Family Medicine

## 2017-01-24 ENCOUNTER — Ambulatory Visit: Payer: 59 | Admitting: Family Medicine

## 2017-01-24 VITALS — BP 112/66 | HR 72 | Temp 98.1°F | Resp 14 | Wt 134.8 lb

## 2017-01-24 DIAGNOSIS — Z23 Encounter for immunization: Secondary | ICD-10-CM

## 2017-01-24 DIAGNOSIS — F3289 Other specified depressive episodes: Secondary | ICD-10-CM | POA: Diagnosis not present

## 2017-01-24 NOTE — Progress Notes (Signed)
   Jasmin Hester  MRN: [id number] DOB: Jun 01, 1955  Subjective:  HPI  Patient is here for 6 months follow up. Last visit was on 07/21/16. Everything was stable at that time. Depression: patient states she is doing ok emotionally. She is taking Sertraline 50 mg daily.  Patient Active Problem List   Diagnosis Date Noted  . Arthritis 12/09/2014  . Cardiac murmur 12/09/2014  . H/O disease 12/09/2014  . Neutropenia ([place]) 12/09/2014  . Female climacteric state 12/09/2014  . Stress incontinence 12/09/2014  . Postmenopausal HRT (hormone replacement therapy) 09/12/2014  . Abnormal LFTs 05/12/2009  . Avitaminosis D 08/07/2008  . Idiopathic scoliosis 07/23/2008  . Clinical depression 07/11/2008  . H/O Malignant melanoma 07/11/2008  . Restless leg 07/11/2008    Past Medical History:  Diagnosis Date  . Cancer [facility])    melanoma 2007    Social History   Socioeconomic History  . Marital status: Married    Spouse name: Herbie [place]  . Number of children: 2  . Years of education: Bachelor's  . Highest education level: Not on file  Social Needs  . Financial resource strain: Not on file  . Food insecurity - worry: Not on file  . Food insecurity - inability: Not on file  . Transportation needs - medical: Not on file  . Transportation needs - non-medical: Not on file  Occupational History  . Not on file  Tobacco Use  . Smoking status: Never Smoker  . Smokeless tobacco: Never Used  Substance and Sexual Activity  . Alcohol use: Yes    Comment: occasional  . Drug use: No  . Sexual activity: Not on file  Other Topics Concern  . Not on file  Social History Narrative  . Not on file    Outpatient Encounter Medications as of 01/24/2017  Medication Sig  . estradiol (ESTRACE) 0.5 MG tablet TAKE 1 TABLET BY MOUTH  EVERY DAY  . sertraline (ZOLOFT) 50 MG tablet TAKE 1 TABLET BY MOUTH  DAILY  . tolterodine (DETROL LA) 4 MG 24 hr capsule TAKE 1 CAPSULE BY MOUTH  DAILY   No  facility-administered encounter medications on file as of 01/24/2017.     Allergies  Allergen Reactions  . Penicillins     Review of Systems  Constitutional: Positive for malaise/fatigue.  Respiratory: Negative.   Cardiovascular: Negative.   Musculoskeletal: Negative.   Neurological: Negative.  Negative for weakness.  Psychiatric/Behavioral: Positive for depression (stable).    Objective:  BP 112/66   Pulse 72   Temp 98.1 F (36.7 C)   Resp 14   Wt 134 lb 12.8 oz (61.1 kg)   BMI 24.46 kg/m   Physical Exam  Constitutional: She is oriented to person, place, and time and well-developed, well-nourished, and in no distress.  HENT:  Head: Normocephalic and atraumatic.  Eyes: Conjunctivae are normal. No scleral icterus.  Neck: No thyromegaly present.  Cardiovascular: Normal rate, regular rhythm and normal heart sounds.  Pulmonary/Chest: Effort normal and breath sounds normal.  Neurological: She is alert and oriented to person, place, and time.  Skin: Skin is warm and dry.  Psychiatric: Mood, memory, affect and judgment normal.    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  I have done the exam and reviewed the chart and it is accurate to the best of my knowledge. Development worker, community has been used and  any errors in dictation or transcription are unintentional. Miguel Aschoff M.D. [facility]

## 2017-03-09 ENCOUNTER — Other Ambulatory Visit: Payer: Self-pay | Admitting: Family Medicine

## 2017-03-09 DIAGNOSIS — Z79890 Hormone replacement therapy (postmenopausal): Secondary | ICD-10-CM

## 2017-03-18 ENCOUNTER — Other Ambulatory Visit: Payer: Self-pay | Admitting: Family Medicine

## 2017-03-18 DIAGNOSIS — N393 Stress incontinence (female) (male): Secondary | ICD-10-CM

## 2017-04-12 ENCOUNTER — Encounter: Payer: Self-pay | Admitting: Family Medicine

## 2017-04-12 ENCOUNTER — Ambulatory Visit (INDEPENDENT_AMBULATORY_CARE_PROVIDER_SITE_OTHER): Payer: Managed Care, Other (non HMO) | Admitting: Family Medicine

## 2017-04-12 VITALS — BP 112/64 | HR 72 | Temp 98.2°F | Resp 16 | Ht 62.5 in | Wt 136.0 lb

## 2017-04-12 DIAGNOSIS — Z0000 Encounter for general adult medical examination without abnormal findings: Secondary | ICD-10-CM

## 2017-04-12 DIAGNOSIS — Z1211 Encounter for screening for malignant neoplasm of colon: Secondary | ICD-10-CM | POA: Diagnosis not present

## 2017-04-12 LAB — IFOBT (OCCULT BLOOD): IFOBT: NEGATIVE

## 2017-04-12 NOTE — Patient Instructions (Addendum)
Start taking estradiol every other day until March. Then, start taking estradiol every 3rd day and stop completely on April 1st.

## 2017-04-12 NOTE — Progress Notes (Signed)
Patient: Jasmin Hester, Female    DOB: 1955/10/10, 62 y.o.   MRN: [id number] Visit Date: 04/12/2017  Today's Provider: Wilhemena Durie, MD   Chief Complaint  Patient presents with  . Annual Exam   Subjective:    Annual physical exam Jasmin Hester is a 62 y.o. female who presents today for health maintenance and complete physical. She feels well. She reports exercising not regularly. She reports she is sleeping fairly well.  Mammogram- 06/30/2016. Normal Pap- 12/11/2015. Normal. HPV negative. Tdap 01/24/2017.  Patient also wants to discuss if she still needs to be taking Estradiol daily.   Review of Systems  Constitutional: Negative.   HENT: Negative.   Eyes: Negative.   Respiratory: Negative.   Cardiovascular: Negative.   Gastrointestinal: Negative.   Endocrine: Negative.   Genitourinary: Negative.   Musculoskeletal: Negative.   Skin: Negative.   Allergic/Immunologic: Negative.   Neurological: Negative.   Hematological: Negative.   Psychiatric/Behavioral: Negative.     Social History      She  reports that  has never smoked. she has never used smokeless tobacco. She reports that she drinks alcohol. She reports that she does not use drugs.       Social History   Socioeconomic History  . Marital status: Married    Spouse name: Herbie [place]  . Number of children: 2  . Years of education: Bachelor's  . Highest education level: None  Social Needs  . Financial resource strain: None  . Food insecurity - worry: None  . Food insecurity - inability: None  . Transportation needs - medical: None  . Transportation needs - non-medical: None  Occupational History  . None  Tobacco Use  . Smoking status: Never Smoker  . Smokeless tobacco: Never Used  Substance and Sexual Activity  . Alcohol use: Yes    Comment: occasional  . Drug use: No  . Sexual activity: None  Other Topics Concern  . None  Social History Narrative  . None    Past Medical History:    Diagnosis Date  . Cancer [facility])    melanoma 2007     Patient Active Problem List   Diagnosis Date Noted  . Arthritis 12/09/2014  . Cardiac murmur 12/09/2014  . Neutropenia ([place]) 12/09/2014  . Female climacteric state 12/09/2014  . Stress incontinence 12/09/2014  . Postmenopausal HRT (hormone replacement therapy) 09/12/2014  . Avitaminosis D 08/07/2008  . Idiopathic scoliosis 07/23/2008  . Clinical depression 07/11/2008  . H/O Malignant melanoma 07/11/2008  . Restless leg 07/11/2008    Past Surgical History:  Procedure Laterality Date  . BREAST EXCISIONAL BIOPSY Left 1993    negative results  . BREAST EXCISIONAL BIOPSY Right 6440,3474    neg    Family History        Family Status  Relation Name Status  . Mat Aunt 3 Alive  . Mother  Alive  . Father  Deceased at age 61  . Brother  Deceased  . Brother  Alive  . Daughter  Alive  . Son  Alive        Her family history includes Arthritis in her mother; Asthma in her brother; Breast cancer (age of onset: 35) in her maternal aunt; CAD in her mother; Cancer in her brother and maternal aunt; Diabetes in her father; Osteoporosis in her mother; Parkinson's disease in her father.      Allergies  Allergen Reactions  . Penicillins      Current  Outpatient Medications:  .  estradiol (ESTRACE) 0.5 MG tablet, TAKE 1 TABLET BY MOUTH  EVERY DAY, Disp: 90 tablet, Rfl: 3 .  sertraline (ZOLOFT) 50 MG tablet, TAKE 1 TABLET BY MOUTH  DAILY, Disp: 90 tablet, Rfl: 1 .  tolterodine (DETROL LA) 4 MG 24 hr capsule, TAKE 1 CAPSULE BY MOUTH  DAILY, Disp: 90 capsule, Rfl: 3   Patient Care Team: Jerrol Banana., MD as PCP - General (Family Medicine)      Objective:   Vitals: BP 112/64 (BP Location: Right Arm, Patient Position: Sitting, Cuff Size: Normal)   Pulse 72   Temp 98.2 F (36.8 C)   Resp 16   Ht 5' 2.5" (1.588 m)   Wt 136 lb (61.7 kg)   BMI 24.48 kg/m    Vitals:   04/12/17 1009  BP: 112/64  Pulse: 72  Resp: 16   Temp: 98.2 F (36.8 C)  Weight: 136 lb (61.7 kg)  Height: 5' 2.5" (1.588 m)     Physical Exam  Constitutional: She is oriented to person, place, and time. She appears well-developed and well-nourished.  HENT:  Head: Normocephalic and atraumatic.  Right Ear: External ear normal.  Left Ear: External ear normal.  Mouth/Throat: Oropharynx is clear and moist.  Eyes: Conjunctivae and EOM are normal. Pupils are equal, round, and reactive to light.  Neck: Normal range of motion. Neck supple.  Cardiovascular: Normal rate, regular rhythm and normal heart sounds.  Pulmonary/Chest: Effort normal.  Abdominal: Soft. Bowel sounds are normal.  Musculoskeletal: Normal range of motion.  Neurological: She is alert and oriented to person, place, and time.  Skin: Skin is warm and dry.  Psychiatric: She has a normal mood and affect. Her behavior is normal. Judgment and thought content normal.     Depression Screen PHQ 2/9 Scores 04/12/2017 01/24/2017 01/22/2016 12/11/2015  PHQ - 2 Score 0 0 1 3  PHQ- 9 Score - 1 2 7       Assessment & Plan:     Routine Health Maintenance and Physical Exam  Exercise Activities and Dietary recommendations Goals    None      Immunization History  Administered Date(s) Administered  . Influenza,inj,Quad PF,6+ Mos 01/24/2017  . Td 01/24/2017  . Tdap 04/22/2006    Health Maintenance  Topic Date Due  . HIV Screening  07/21/1970  . MAMMOGRAM  07/01/2018  . PAP SMEAR  12/11/2018  . COLONOSCOPY  12/28/2023  . TETANUS/TDAP  01/25/2027  . INFLUENZA VACCINE  Completed  . Hepatitis C Screening  Completed     Discussed health benefits of physical activity, and encouraged her to engage in regular exercise appropriate for her age and condition.  HRT Pt would like to stop HRT--will wean over next month.    I have done the exam and reviewed the above chart and it is accurate to the best of my knowledge. Development worker, community has been used in this note in any  air is in the dictation or transcription are unintentional.  Wilhemena Durie, MD  [place]

## 2017-05-11 ENCOUNTER — Telehealth: Payer: Self-pay

## 2017-05-11 LAB — TSH: TSH: 1.54 u[IU]/mL (ref 0.450–4.500)

## 2017-05-11 LAB — CBC WITH DIFFERENTIAL/PLATELET
Basophils Absolute: 0 10*3/uL (ref 0.0–0.2)
Basos: 0 %
EOS (ABSOLUTE): 0 10*3/uL (ref 0.0–0.4)
EOS: 1 %
HEMATOCRIT: 40.3 % (ref 34.0–46.6)
HEMOGLOBIN: 13.1 g/dL (ref 11.1–15.9)
Immature Grans (Abs): 0 10*3/uL (ref 0.0–0.1)
Immature Granulocytes: 0 %
LYMPHS ABS: 1.1 10*3/uL (ref 0.7–3.1)
Lymphs: 35 %
MCH: 25.7 pg — AB (ref 26.6–33.0)
MCHC: 32.5 g/dL (ref 31.5–35.7)
MCV: 79 fL (ref 79–97)
MONOCYTES: 10 %
MONOS ABS: 0.3 10*3/uL (ref 0.1–0.9)
NEUTROS ABS: 1.7 10*3/uL (ref 1.4–7.0)
Neutrophils: 54 %
Platelets: 280 10*3/uL (ref 150–379)
RBC: 5.09 x10E6/uL (ref 3.77–5.28)
RDW: 15.1 % (ref 12.3–15.4)
WBC: 3.2 10*3/uL — AB (ref 3.4–10.8)

## 2017-05-11 LAB — COMPREHENSIVE METABOLIC PANEL
A/G RATIO: 1.4 (ref 1.2–2.2)
ALBUMIN: 4.3 g/dL (ref 3.6–4.8)
ALK PHOS: 64 IU/L (ref 39–117)
ALT: 15 IU/L (ref 0–32)
AST: 17 IU/L (ref 0–40)
BILIRUBIN TOTAL: 0.7 mg/dL (ref 0.0–1.2)
BUN / CREAT RATIO: 7 — AB (ref 12–28)
BUN: 6 mg/dL — ABNORMAL LOW (ref 8–27)
CHLORIDE: 95 mmol/L — AB (ref 96–106)
CO2: 25 mmol/L (ref 20–29)
CREATININE: 0.85 mg/dL (ref 0.57–1.00)
Calcium: 9.4 mg/dL (ref 8.7–10.3)
GFR calc Af Amer: 86 mL/min/{1.73_m2} (ref >59)
GFR calc non Af Amer: 74 mL/min/{1.73_m2} (ref >59)
GLOBULIN, TOTAL: 3.1 g/dL (ref 1.5–4.5)
Glucose: 81 mg/dL (ref 65–99)
POTASSIUM: 4.4 mmol/L (ref 3.5–5.2)
Sodium: 140 mmol/L (ref 134–144)
Total Protein: 7.4 g/dL (ref 6.0–8.5)

## 2017-05-11 LAB — LIPID PANEL
CHOLESTEROL TOTAL: 197 mg/dL (ref 100–199)
Chol/HDL Ratio: 2.8 ratio (ref 0.0–4.4)
HDL: 70 mg/dL (ref >39)
LDL Calculated: 112 mg/dL — ABNORMAL HIGH (ref 0–99)
TRIGLYCERIDES: 75 mg/dL (ref 0–149)
VLDL Cholesterol Cal: 15 mg/dL (ref 5–40)

## 2017-05-11 NOTE — Telephone Encounter (Signed)
Tried calling patient to advise her of lab results. Left message to call back.

## 2017-05-11 NOTE — Telephone Encounter (Signed)
-----   Message from Jerrol Banana., MD sent at 05/11/2017  8:11 AM EDT ----- Labs OK.

## 2017-05-11 NOTE — Telephone Encounter (Signed)
Advised  ED 

## 2017-05-14 ENCOUNTER — Other Ambulatory Visit: Payer: Self-pay | Admitting: Family Medicine

## 2017-05-18 ENCOUNTER — Other Ambulatory Visit: Payer: Self-pay | Admitting: Family Medicine

## 2017-05-18 DIAGNOSIS — Z1231 Encounter for screening mammogram for malignant neoplasm of breast: Secondary | ICD-10-CM

## 2017-07-01 ENCOUNTER — Ambulatory Visit
Admission: RE | Admit: 2017-07-01 | Discharge: 2017-07-01 | Disposition: A | Discharge status: Home / Self Care | Payer: Managed Care, Other (non HMO) | Source: Ambulatory Visit | Attending: Family Medicine | Admitting: Family Medicine

## 2017-07-01 DIAGNOSIS — Z1231 Encounter for screening mammogram for malignant neoplasm of breast: Secondary | ICD-10-CM | POA: Diagnosis present

## 2017-07-25 ENCOUNTER — Ambulatory Visit (INDEPENDENT_AMBULATORY_CARE_PROVIDER_SITE_OTHER): Payer: Managed Care, Other (non HMO) | Admitting: Family Medicine

## 2017-07-25 VITALS — BP 104/60 | HR 66 | Temp 97.9°F | Resp 16 | Wt 127.0 lb

## 2017-07-25 DIAGNOSIS — Z79890 Hormone replacement therapy (postmenopausal): Secondary | ICD-10-CM | POA: Diagnosis not present

## 2017-07-25 DIAGNOSIS — F3289 Other specified depressive episodes: Secondary | ICD-10-CM | POA: Diagnosis not present

## 2017-07-25 NOTE — Progress Notes (Signed)
Jasmin Hester  MRN: [id number] DOB: 11-Oct-1955  Subjective:  HPI   The patient is a 62 year old female who presents today for follow up after weaning down on her Estradoil.  She states she is down to taking it ever 2-3 days.  She reports she is feeling fine and sees no increase in her depression and no other sign or symptoms of menopause.Mild hot flashes/sweats. She feels well physically and emotionally.  Patient Active Problem List   Diagnosis Date Noted  . Arthritis 12/09/2014  . Cardiac murmur 12/09/2014  . Neutropenia ([place]) 12/09/2014  . Female climacteric state 12/09/2014  . Stress incontinence 12/09/2014  . Postmenopausal HRT (hormone replacement therapy) 09/12/2014  . Avitaminosis D 08/07/2008  . Idiopathic scoliosis 07/23/2008  . Clinical depression 07/11/2008  . H/O Malignant melanoma 07/11/2008  . Restless leg 07/11/2008    Past Medical History:  Diagnosis Date  . Cancer [facility])    melanoma 2007    Social History   Socioeconomic History  . Marital status: Married    Spouse name: Herbie [place]  . Number of children: 2  . Years of education: Bachelor's  . Highest education level: Not on file  Occupational History  . Not on file  Social Needs  . Financial resource strain: Not on file  . Food insecurity:    Worry: Not on file    Inability: Not on file  . Transportation needs:    Medical: Not on file    Non-medical: Not on file  Tobacco Use  . Smoking status: Never Smoker  . Smokeless tobacco: Never Used  Substance and Sexual Activity  . Alcohol use: Yes    Comment: occasional  . Drug use: No  . Sexual activity: Not on file  Lifestyle  . Physical activity:    Days per week: Not on file    Minutes per session: Not on file  . Stress: Not on file  Relationships  . Social connections:    Talks on phone: Not on file    Gets together: Not on file    Attends religious service: Not on file    Active member of club or organization: Not on file    Attends  meetings of clubs or organizations: Not on file    Relationship status: Not on file  . Intimate partner violence:    Fear of current or ex partner: Not on file    Emotionally abused: Not on file    Physically abused: Not on file    Forced sexual activity: Not on file  Other Topics Concern  . Not on file  Social History Narrative  . Not on file    Outpatient Encounter Medications as of 07/25/2017  Medication Sig  . estradiol (ESTRACE) 0.5 MG tablet TAKE 1 TABLET BY MOUTH  EVERY DAY (Patient taking differently: 1 every 2-3 days)  . sertraline (ZOLOFT) 50 MG tablet TAKE 1 TABLET BY MOUTH  DAILY  . tolterodine (DETROL LA) 4 MG 24 hr capsule TAKE 1 CAPSULE BY MOUTH  DAILY   No facility-administered encounter medications on file as of 07/25/2017.     Allergies  Allergen Reactions  . Penicillins     Review of Systems  Constitutional: Negative.   Eyes: Negative.   Respiratory: Negative for cough, shortness of breath and wheezing.   Cardiovascular: Negative for chest pain, palpitations, orthopnea, claudication and leg swelling.  Genitourinary: Negative.   Skin: Negative.   Endo/Heme/Allergies: Negative.   Psychiatric/Behavioral: Negative.  Objective:  BP 104/60 (BP Location: Right Arm, Patient Position: Sitting, Cuff Size: Normal)   Pulse 66   Temp 97.9 F (36.6 C) (Oral)   Resp 16   Wt 127 lb (57.6 kg)   BMI 22.86 kg/m   Physical Exam  Constitutional: She is oriented to person, place, and time and well-developed, well-nourished, and in no distress.  HENT:  Head: Normocephalic and atraumatic.  Eyes: Conjunctivae are normal. No scleral icterus.  Neck: No thyromegaly present.  Cardiovascular: Normal rate, regular rhythm and normal heart sounds.  Pulmonary/Chest: Effort normal and breath sounds normal.  Abdominal: Soft.  Neurological: She is alert and oriented to person, place, and time. Gait normal. GCS score is 15.  Skin: Skin is warm and dry.  Very fair skin.    Psychiatric: Mood, memory, affect and judgment normal.    Assessment and Plan :  HRT Discontinue. Mild Depression Continue Zoloft--RTC 6 months.  I have done the exam and reviewed the chart and it is accurate to the best of my knowledge. Development worker, community has been used and  any errors in dictation or transcription are unintentional. Miguel Aschoff M.D. [facility]

## 2018-01-25 ENCOUNTER — Ambulatory Visit: Payer: Self-pay | Admitting: Family Medicine

## 2018-01-31 ENCOUNTER — Ambulatory Visit (INDEPENDENT_AMBULATORY_CARE_PROVIDER_SITE_OTHER): Payer: Managed Care, Other (non HMO) | Admitting: Family Medicine

## 2018-01-31 ENCOUNTER — Encounter: Payer: Self-pay | Admitting: Family Medicine

## 2018-01-31 VITALS — BP 110/68 | HR 62 | Temp 98.0°F | Resp 16 | Ht 63.0 in | Wt 137.0 lb

## 2018-01-31 DIAGNOSIS — Z23 Encounter for immunization: Secondary | ICD-10-CM | POA: Diagnosis not present

## 2018-01-31 DIAGNOSIS — F3289 Other specified depressive episodes: Secondary | ICD-10-CM

## 2018-01-31 DIAGNOSIS — Z79890 Hormone replacement therapy (postmenopausal): Secondary | ICD-10-CM | POA: Diagnosis not present

## 2018-01-31 MED ORDER — ESTRADIOL 0.5 MG PO TABS
0.5000 mg | ORAL_TABLET | Freq: Every day | ORAL | 3 refills | Status: DC
Start: 2018-01-31 — End: 2018-03-30

## 2018-01-31 NOTE — Progress Notes (Signed)
Patient: Jasmin Hester Female    DOB: 05-11-1955   62 y.o.   MRN: [id number] Visit Date: 01/31/2018  Today's Provider: Wilhemena Durie, MD   Chief Complaint  Patient presents with  . Depression  . hormone replacement therapy   Subjective:    HPI Patient comes in today for a follow up. She was last seen in the office 6 months ago. On last visit, patient was advised to discontinue estradiol. She reports that she tried it for a few months, but it seemed to make her depression worse. Patient reports that she lost interest in doing things. Once she started back on estradiol, her depression improved.  She is not suicidal or homicidal and has no interest in hurting herself but she does have almost complete anhedonia. Depression screen [facility] 2/9 04/12/2017 01/24/2017 01/22/2016  Decreased Interest 0 0 0  Down, Depressed, Hopeless 0 0 1  PHQ - 2 Score 0 0 1  Altered sleeping - 0 1  Tired, decreased energy - 1 0  Change in appetite - 0 0  Feeling bad or failure about yourself  - 0 0  Trouble concentrating - 0 0  Moving slowly or fidgety/restless - 0 0  Suicidal thoughts - 0 0  PHQ-9 Score - 1 2  Difficult doing work/chores - Not difficult at all Not difficult at all       Allergies  Allergen Reactions  . Penicillins      Current Outpatient Medications:  .  sertraline (ZOLOFT) 50 MG tablet, TAKE 1 TABLET BY MOUTH  DAILY, Disp: 90 tablet, Rfl: 3 .  tolterodine (DETROL LA) 4 MG 24 hr capsule, TAKE 1 CAPSULE BY MOUTH  DAILY, Disp: 90 capsule, Rfl: 3  Review of Systems  Constitutional: Negative.   Eyes: Negative.   Respiratory: Negative for cough and shortness of breath.   Cardiovascular: Negative.   Gastrointestinal: Negative.   Endocrine: Negative.   Allergic/Immunologic: Negative.   Psychiatric/Behavioral: Negative for agitation, decreased concentration, dysphoric mood, self-injury, sleep disturbance and suicidal ideas. The patient is not nervous/anxious and is not  hyperactive.     Social History   Tobacco Use  . Smoking status: Never Smoker  . Smokeless tobacco: Never Used  Substance Use Topics  . Alcohol use: Yes    Comment: occasional   Objective:   BP 110/68 (BP Location: Left Arm, Patient Position: Sitting, Cuff Size: Normal)   Pulse 62   Temp 98 F (36.7 C)   Resp 16   Ht 5\' 3"  (1.6 m)   Wt 137 lb (62.1 kg)   SpO2 97%   BMI 24.27 kg/m  Vitals:   01/31/18 0858  BP: 110/68  Pulse: 62  Resp: 16  Temp: 98 F (36.7 C)  SpO2: 97%  Weight: 137 lb (62.1 kg)  Height: 5\' 3"  (1.6 m)     Physical Exam Constitutional:      Appearance: Normal appearance.  HENT:     Head: Normocephalic and atraumatic.     Nose: Nose normal.  Eyes:     General: No scleral icterus.    Conjunctiva/sclera: Conjunctivae normal.  Cardiovascular:     Rate and Rhythm: Normal rate and regular rhythm.     Heart sounds: Normal heart sounds.  Pulmonary:     Effort: Pulmonary effort is normal.     Breath sounds: Normal breath sounds.  Abdominal:     Palpations: Abdomen is soft.  Musculoskeletal:  General: No swelling.  Lymphadenopathy:     Cervical: No cervical adenopathy.  Skin:    General: Skin is warm and dry.  Neurological:     General: No focal deficit present.     Mental Status: She is alert.  Psychiatric:        Mood and Affect: Mood normal.        Behavior: Behavior normal.        Thought Content: Thought content normal.        Judgment: Judgment normal.         Assessment & Plan:     I, Rachelle L. Presley, CMA, am acting as a Education administrator for [employer]. Rosanna Randy, MD.   1. Depression Continue present medications.  Refer to Eugenia Pancoast for counseling.I  Think this will make a big difference for her.  2. Need for immunization against influenza  - Flu Vaccine QUAD 6+ mos PF IM (Fluarix Quad PF)  3. Postmenopausal HRT (hormone replacement therapy)        I have done the exam and reviewed the above chart and it is accurate  to the best of my knowledge. Development worker, community has been used in this note in any air is in the dictation or transcription are unintentional.   Wilhemena Durie, MD  [place]

## 2018-03-21 ENCOUNTER — Other Ambulatory Visit: Payer: Self-pay | Admitting: Family Medicine

## 2018-03-25 ENCOUNTER — Other Ambulatory Visit: Payer: Self-pay | Admitting: Family Medicine

## 2018-03-25 DIAGNOSIS — N393 Stress incontinence (female) (male): Secondary | ICD-10-CM

## 2018-03-29 ENCOUNTER — Other Ambulatory Visit: Payer: Self-pay | Admitting: Family Medicine

## 2018-03-29 DIAGNOSIS — Z79890 Hormone replacement therapy (postmenopausal): Secondary | ICD-10-CM

## 2018-05-23 ENCOUNTER — Other Ambulatory Visit: Payer: Self-pay | Admitting: Family Medicine

## 2018-05-23 DIAGNOSIS — Z1231 Encounter for screening mammogram for malignant neoplasm of breast: Secondary | ICD-10-CM

## 2018-05-25 ENCOUNTER — Encounter: Payer: Managed Care, Other (non HMO) | Admitting: Family Medicine

## 2018-07-20 ENCOUNTER — Ambulatory Visit
Admission: RE | Admit: 2018-07-20 | Discharge: 2018-07-20 | Disposition: A | Discharge status: Home / Self Care | Payer: Managed Care, Other (non HMO) | Source: Ambulatory Visit | Attending: Family Medicine | Admitting: Family Medicine

## 2018-07-20 ENCOUNTER — Other Ambulatory Visit: Payer: Self-pay

## 2018-07-20 DIAGNOSIS — Z1231 Encounter for screening mammogram for malignant neoplasm of breast: Secondary | ICD-10-CM | POA: Diagnosis not present

## 2018-08-02 ENCOUNTER — Ambulatory Visit: Payer: Self-pay | Admitting: Family Medicine

## 2018-12-13 ENCOUNTER — Ambulatory Visit: Payer: Managed Care, Other (non HMO) | Admitting: Family Medicine

## 2018-12-19 NOTE — Progress Notes (Signed)
Patient: Jasmin Hester, Female    DOB: [birth year], 63 y.o.   MRN: [id number] Visit Date: 12/20/2018  Today's Provider: Wilhemena Durie, MD   Chief Complaint  Patient presents with  . Annual Exam   Subjective:     Annual physical exam Jasmin Hester is a 63 y.o. female who presents today for health maintenance and complete physical. She feels well. She reports exercising none. She reports she is sleeping well.She is married and has 2 children and 1 51 yo granddaughter. She is s/p supracervical hysterectomy 2012  -----------------------------------------------------------   Review of Systems  Constitutional: Negative.   HENT: Negative.   Eyes: Negative.   Respiratory: Negative.   Cardiovascular: Negative.   Gastrointestinal: Negative.   Endocrine: Negative.   Genitourinary: Negative.   Musculoskeletal: Negative.   Skin: Negative.   Allergic/Immunologic: Negative.   Neurological: Positive for tremors.       RLS  Over the past year. She also bounces her legs during the day.  Hematological: Negative.   Psychiatric/Behavioral: Negative.     Social History      She  reports that she has never smoked. She has never used smokeless tobacco. She reports current alcohol use. She reports that she does not use drugs.       Social History   Socioeconomic History  . Marital status: Married    Spouse name: Herbie Baltimore  . Number of children: 2  . Years of education: Bachelor's  . Highest education level: Not on file  Occupational History  . Not on file  Social Needs  . Financial resource strain: Not on file  . Food insecurity    Worry: Not on file    Inability: Not on file  . Transportation needs    Medical: Not on file    Non-medical: Not on file  Tobacco Use  . Smoking status: Never Smoker  . Smokeless tobacco: Never Used  Substance and Sexual Activity  . Alcohol use: Yes    Comment: occasional  . Drug use: No  . Sexual activity: Not on file  Lifestyle  .  Physical activity    Days per week: Not on file    Minutes per session: Not on file  . Stress: Not on file  Relationships  . Social Herbalist on phone: Not on file    Gets together: Not on file    Attends religious service: Not on file    Active member of club or organization: Not on file    Attends meetings of clubs or organizations: Not on file    Relationship status: Not on file  Other Topics Concern  . Not on file  Social History Narrative  . Not on file    Past Medical History:  Diagnosis Date  . Cancer [facility])    melanoma 2007     Patient Active Problem List   Diagnosis Date Noted  . Arthritis 12/09/2014  . Cardiac murmur 12/09/2014  . Neutropenia ([place]) 12/09/2014  . Female climacteric state 12/09/2014  . Stress incontinence 12/09/2014  . Postmenopausal HRT (hormone replacement therapy) 09/12/2014  . Avitaminosis D 08/07/2008  . Idiopathic scoliosis 07/23/2008  . Clinical depression 07/11/2008  . H/O Malignant melanoma 07/11/2008  . Restless leg 07/11/2008    Past Surgical History:  Procedure Laterality Date  . BREAST EXCISIONAL BIOPSY Left 1993    negative results  . BREAST EXCISIONAL BIOPSY Right [id number]    neg  Family History        Family Status  Relation Name Status  . Mat Aunt 3 Alive  . Mother  Alive  . Father  Deceased at age 67  . Brother  Deceased  . Brother  Alive  . Daughter  Alive  . Son  Alive        Her family history includes Arthritis in her mother; Asthma in her brother; Breast cancer (age of onset: 49) in her maternal aunt; CAD in her mother; Cancer in her brother and maternal aunt; Diabetes in her father; Osteoporosis in her mother; Parkinson's disease in her father.      Allergies  Allergen Reactions  . Penicillins      Current Outpatient Medications:  .  estradiol (ESTRACE) 0.5 MG tablet, TAKE 1 TABLET BY MOUTH  EVERY DAY, Disp: 90 tablet, Rfl: 3 .  sertraline (ZOLOFT) 50 MG tablet, TAKE 1 TABLET BY MOUTH   DAILY, Disp: 90 tablet, Rfl: 3 .  tolterodine (DETROL LA) 4 MG 24 hr capsule, TAKE 1 CAPSULE BY MOUTH  DAILY, Disp: 90 capsule, Rfl: 3   Patient Care Team: Jerrol Banana., MD as PCP - General (Family Medicine)    Objective:    Vitals: BP 119/73 (BP Location: Right Arm, Patient Position: Sitting, Cuff Size: Large)   Pulse 71   Temp (!) 97.3 F (36.3 C) (Other (Comment))   Resp 16   Ht 5\' 3"  (1.6 m)   Wt 145 lb (65.8 kg)   SpO2 97%   BMI 25.69 kg/m    Vitals:   12/20/18 1525  BP: 119/73  Pulse: 71  Resp: 16  Temp: (!) 97.3 F (36.3 C)  TempSrc: Other (Comment)  SpO2: 97%  Weight: 145 lb (65.8 kg)  Height: 5\' 3"  (1.6 m)     Physical Exam Vitals signs reviewed.  Constitutional:      Appearance: She is well-developed.  HENT:     Head: Normocephalic and atraumatic.     Right Ear: External ear normal.     Left Ear: External ear normal.  Eyes:     Conjunctiva/sclera: Conjunctivae normal.     Pupils: Pupils are equal, round, and reactive to light.  Neck:     Musculoskeletal: Normal range of motion and neck supple.  Cardiovascular:     Rate and Rhythm: Normal rate and regular rhythm.     Heart sounds: Normal heart sounds.  Pulmonary:     Effort: Pulmonary effort is normal.  Abdominal:     General: Bowel sounds are normal.     Palpations: Abdomen is soft.  Musculoskeletal: Normal range of motion.  Skin:    General: Skin is warm and dry.  Neurological:     General: No focal deficit present.     Mental Status: She is alert and oriented to person, place, and time.  Psychiatric:        Mood and Affect: Mood normal.        Behavior: Behavior normal.        Thought Content: Thought content normal.        Judgment: Judgment normal.      Depression Screen PHQ 2/9 Scores 12/20/2018 04/12/2017 01/24/2017 01/22/2016  PHQ - 2 Score 1 0 0 1  PHQ- 9 Score 5 - 1 2       Assessment & Plan:     Routine Health Maintenance and Physical Exam  Exercise  Activities and Dietary recommendations Goals   None  Immunization History  Administered Date(s) Administered  . Influenza,inj,Quad PF,6+ Mos 01/24/2017, 01/31/2018, 12/07/2018  . Td 01/24/2017  . Tdap 04/22/2006    Health Maintenance  Topic Date Due  . HIV Screening  07/21/1970  . PAP SMEAR-Modifier  12/11/2018  . MAMMOGRAM  07/19/2020  . COLONOSCOPY  12/28/2023  . TETANUS/TDAP  01/25/2027  . INFLUENZA VACCINE  Completed  . Hepatitis C Screening  Completed     Discussed health benefits of physical activity, and encouraged her to engage in regular exercise appropriate for her age and condition.    --------------------------------------------------------------------  1. Annual physical exam Pap 2022. - TSH - Lipid panel - CBC w/Diff/Platelet - Comprehensive Metabolic Panel (CMET) 2.h/o Melanoma  Followed by dermatology Follow up in one year for CPE.   Cassandra Harbold Cranford Mon, MD  [facility]

## 2018-12-20 ENCOUNTER — Encounter: Payer: Self-pay | Admitting: Family Medicine

## 2018-12-20 ENCOUNTER — Ambulatory Visit (INDEPENDENT_AMBULATORY_CARE_PROVIDER_SITE_OTHER): Payer: Managed Care, Other (non HMO) | Admitting: Family Medicine

## 2018-12-20 ENCOUNTER — Other Ambulatory Visit: Payer: Self-pay

## 2018-12-20 VITALS — BP 119/73 | HR 71 | Temp 97.3°F | Resp 16 | Ht 63.0 in | Wt 145.0 lb

## 2018-12-20 DIAGNOSIS — Z0000 Encounter for general adult medical examination without abnormal findings: Secondary | ICD-10-CM | POA: Diagnosis not present

## 2018-12-20 DIAGNOSIS — Z85820 Personal history of malignant melanoma of skin: Secondary | ICD-10-CM | POA: Diagnosis not present

## 2019-01-04 LAB — COMPREHENSIVE METABOLIC PANEL
ALT: 11 IU/L (ref 0–32)
AST: 21 IU/L (ref 0–40)
Albumin/Globulin Ratio: 1.4 (ref 1.2–2.2)
Albumin: 4.2 g/dL (ref 3.8–4.8)
Alkaline Phosphatase: 71 IU/L (ref 39–117)
BUN/Creatinine Ratio: 10 — ABNORMAL LOW (ref 12–28)
BUN: 10 mg/dL (ref 8–27)
Bilirubin Total: 0.7 mg/dL (ref 0.0–1.2)
CO2: 26 mmol/L (ref 20–29)
Calcium: 9.5 mg/dL (ref 8.7–10.3)
Chloride: 104 mmol/L (ref 96–106)
Creatinine, Ser: 1.01 mg/dL — ABNORMAL HIGH (ref 0.57–1.00)
GFR calc Af Amer: 68 mL/min/{1.73_m2} (ref >59)
GFR calc non Af Amer: 59 mL/min/{1.73_m2} — ABNORMAL LOW (ref >59)
Globulin, Total: 2.9 g/dL (ref 1.5–4.5)
Glucose: 88 mg/dL (ref 65–99)
Potassium: 4.9 mmol/L (ref 3.5–5.2)
Sodium: 142 mmol/L (ref 134–144)
Total Protein: 7.1 g/dL (ref 6.0–8.5)

## 2019-01-04 LAB — CBC WITH DIFFERENTIAL/PLATELET
Basophils Absolute: 0 10*3/uL (ref 0.0–0.2)
Basos: 1 %
EOS (ABSOLUTE): 0 10*3/uL (ref 0.0–0.4)
Eos: 1 %
Hematocrit: 37 % (ref 34.0–46.6)
Hemoglobin: 11.8 g/dL (ref 11.1–15.9)
Immature Grans (Abs): 0 10*3/uL (ref 0.0–0.1)
Immature Granulocytes: 0 %
Lymphocytes Absolute: 1.4 10*3/uL (ref 0.7–3.1)
Lymphs: 37 %
MCH: 25.8 pg — ABNORMAL LOW (ref 26.6–33.0)
MCHC: 31.9 g/dL (ref 31.5–35.7)
MCV: 81 fL (ref 79–97)
Monocytes Absolute: 0.4 10*3/uL (ref 0.1–0.9)
Monocytes: 11 %
Neutrophils Absolute: 1.9 10*3/uL (ref 1.4–7.0)
Neutrophils: 50 %
Platelets: 266 10*3/uL (ref 150–450)
RBC: 4.58 x10E6/uL (ref 3.77–5.28)
RDW: 14.9 % (ref 11.7–15.4)
WBC: 3.8 10*3/uL (ref 3.4–10.8)

## 2019-01-04 LAB — LIPID PANEL
Chol/HDL Ratio: 2.2 ratio (ref 0.0–4.4)
Cholesterol, Total: 237 mg/dL — ABNORMAL HIGH (ref 100–199)
HDL: 107 mg/dL (ref >39)
LDL Chol Calc (NIH): 118 mg/dL — ABNORMAL HIGH (ref 0–99)
Triglycerides: 72 mg/dL (ref 0–149)
VLDL Cholesterol Cal: 12 mg/dL (ref 5–40)

## 2019-01-04 LAB — TSH: TSH: 2.05 u[IU]/mL (ref 0.450–4.500)

## 2019-01-05 ENCOUNTER — Telehealth: Payer: Self-pay

## 2019-01-05 NOTE — Telephone Encounter (Signed)
Called and spoke with patient about labs being ok and informed her to avoid taking NSAIDS. She gave verbal understanding although she stated she does take aleve periodically for her scoliosis.

## 2019-01-05 NOTE — Telephone Encounter (Signed)
-----   Message from Jerrol Banana., MD sent at 01/05/2019  8:45 AM EST ----- Labs ok--hydrate,exercise and avoid NSAID such as advil or aleve.

## 2019-02-25 ENCOUNTER — Other Ambulatory Visit: Payer: Self-pay | Admitting: Family Medicine

## 2019-02-25 DIAGNOSIS — N393 Stress incontinence (female) (male): Secondary | ICD-10-CM

## 2019-02-26 NOTE — Telephone Encounter (Signed)
Requested medication (s) are due for refill today: yes  Requested medication (s) are on the active medication list: yes  Last refill:  01/05/2019  Future visit scheduled: no  Notes to clinic:  overdue for follow up  Review for refill   Requested Prescriptions  Pending Prescriptions Disp Refills   tolterodine (DETROL LA) 4 MG 24 hr capsule [Pharmacy Med Name: TOLTERODINE  4MG   CAP  EXTENDED RELEASE] 90 capsule 3    Sig: TAKE 1 CAPSULE BY MOUTH  DAILY      Urology:  Bladder Agents Failed - 02/25/2019  9:15 PM      Failed - Valid encounter within last 12 months    Recent Outpatient Visits           2 months ago Annual physical exam   [facility] Jerrol Banana., MD   1 year ago Other depression   [facility] Jerrol Banana., MD   1 year ago Postmenopausal HRT (hormone replacement therapy)   [facility] Jerrol Banana., MD   1 year ago Annual physical exam   [facility] Jerrol Banana., MD   2 years ago Other depression   [facility] Jerrol Banana., MD

## 2019-03-01 ENCOUNTER — Other Ambulatory Visit: Payer: Self-pay | Admitting: Family Medicine

## 2019-03-01 DIAGNOSIS — Z79890 Hormone replacement therapy (postmenopausal): Secondary | ICD-10-CM

## 2019-03-17 ENCOUNTER — Other Ambulatory Visit: Payer: Self-pay | Admitting: Family Medicine

## 2019-03-17 NOTE — Telephone Encounter (Signed)
Requested medication (s) are due for refill today: yes  Requested medication (s) are on the active medication list: yes  Last refill:  01/25/2019  Future visit scheduled: no  Notes to clinic:  no valid encounter in last 6 months    Requested Prescriptions  Pending Prescriptions Disp Refills   sertraline (ZOLOFT) 50 MG tablet [Pharmacy Med Name: SERTRALINE HCL 50MG  TABLET] 90 tablet 3    Sig: TAKE 1 TABLET BY MOUTH  DAILY      Psychiatry:  Antidepressants - SSRI Failed - 03/17/2019  9:13 PM      Failed - Valid encounter within last 6 months    Recent Outpatient Visits           2 months ago Annual physical exam   [facility] Jerrol Banana., MD   1 year ago Other depression   [facility] Jerrol Banana., MD   1 year ago Postmenopausal HRT (hormone replacement therapy)   [facility] Jerrol Banana., MD   1 year ago Annual physical exam   [facility] Jerrol Banana., MD   2 years ago Other depression   [facility] Jerrol Banana., MD              Passed - Completed PHQ-2 or PHQ-9 in the last 360 days.

## 2019-07-17 ENCOUNTER — Other Ambulatory Visit: Payer: Self-pay | Admitting: Family Medicine

## 2019-07-17 DIAGNOSIS — Z1231 Encounter for screening mammogram for malignant neoplasm of breast: Secondary | ICD-10-CM

## 2019-07-24 ENCOUNTER — Ambulatory Visit
Admission: RE | Admit: 2019-07-24 | Discharge: 2019-07-24 | Disposition: A | Discharge status: Home / Self Care | Payer: Managed Care, Other (non HMO) | Source: Ambulatory Visit | Attending: Family Medicine | Admitting: Family Medicine

## 2019-07-24 DIAGNOSIS — Z1231 Encounter for screening mammogram for malignant neoplasm of breast: Secondary | ICD-10-CM | POA: Insufficient documentation

## 2019-12-24 ENCOUNTER — Ambulatory Visit (INDEPENDENT_AMBULATORY_CARE_PROVIDER_SITE_OTHER): Payer: Managed Care, Other (non HMO) | Admitting: Family Medicine

## 2019-12-24 ENCOUNTER — Other Ambulatory Visit: Payer: Self-pay

## 2019-12-24 ENCOUNTER — Other Ambulatory Visit (HOSPITAL_COMMUNITY)
Admission: RE | Admit: 2019-12-24 | Discharge: 2019-12-24 | Disposition: A | Discharge status: Home / Self Care | Payer: Managed Care, Other (non HMO) | Source: Ambulatory Visit | Attending: Family Medicine | Admitting: Family Medicine

## 2019-12-24 ENCOUNTER — Encounter: Payer: Self-pay | Admitting: Family Medicine

## 2019-12-24 VITALS — BP 123/51 | HR 65 | Temp 98.0°F | Resp 16 | Ht 63.0 in | Wt 146.0 lb

## 2019-12-24 DIAGNOSIS — Z0000 Encounter for general adult medical examination without abnormal findings: Secondary | ICD-10-CM

## 2019-12-24 DIAGNOSIS — Z124 Encounter for screening for malignant neoplasm of cervix: Secondary | ICD-10-CM | POA: Diagnosis present

## 2019-12-24 DIAGNOSIS — Z23 Encounter for immunization: Secondary | ICD-10-CM | POA: Diagnosis not present

## 2019-12-24 NOTE — Progress Notes (Signed)
Complete physical exam   Patient: Jasmin Hester   DOB: [birth year]   64 y.o. Female  MRN: [id number] Visit Date: 12/24/2019  Today's healthcare provider: Wilhemena Durie, MD   Chief Complaint  Patient presents with  . Annual Exam   Subjective    Jasmin Hester is a 64 y.o. female who presents today for a complete physical exam.  She reports consuming a general diet. The patient does not participate in regular exercise at present. She generally feels well. She reports sleeping fairly well. She does not have additional problems to discuss today.  Patient had a supracervical hysterectomy years ago.  Past Medical History:  Diagnosis Date  . Cancer ([place])    melanoma 2007   Past Surgical History:  Procedure Laterality Date  . BREAST EXCISIONAL BIOPSY Left 1993    negative results  . BREAST EXCISIONAL BIOPSY Right 1950,9326    neg   Social History   Socioeconomic History  . Marital status: Married    Spouse name: Herbie Baltimore  . Number of children: 2  . Years of education: Bachelor's  . Highest education level: Not on file  Occupational History  . Not on file  Tobacco Use  . Smoking status: Never Smoker  . Smokeless tobacco: Never Used  Vaping Use  . Vaping Use: Never used  Substance and Sexual Activity  . Alcohol use: Yes    Comment: occasional  . Drug use: No  . Sexual activity: Not on file  Other Topics Concern  . Not on file  Social History Narrative  . Not on file   Social Determinants of Health   Financial Resource Strain:   . Difficulty of Paying Living Expenses: Not on file  Food Insecurity:   . Worried About Charity fundraiser in the Last Year: Not on file  . Ran Out of Food in the Last Year: Not on file  Transportation Needs:   . Lack of Transportation (Medical): Not on file  . Lack of Transportation (Non-Medical): Not on file  Physical Activity:   . Days of Exercise per Week: Not on file  . Minutes of Exercise per Session: Not on file    Stress:   . Feeling of Stress : Not on file  Social Connections:   . Frequency of Communication with Friends and Family: Not on file  . Frequency of Social Gatherings with Friends and Family: Not on file  . Attends Religious Services: Not on file  . Active Member of Clubs or Organizations: Not on file  . Attends Archivist Meetings: Not on file  . Marital Status: Not on file  Intimate Partner Violence:   . Fear of Current or Ex-Partner: Not on file  . Emotionally Abused: Not on file  . Physically Abused: Not on file  . Sexually Abused: Not on file   Family Status  Relation Name Status  . Mat Aunt 3 Alive  . Mother  Alive  . Father  Deceased at age 48  . Brother  Deceased  . Brother  Alive  . Daughter  Alive  . Son  Alive   Family History  Problem Relation Age of Onset  . Breast cancer Maternal Aunt 83       3 aunts   . Cancer Maternal Aunt        breast   . Arthritis Mother   . CAD Mother   . Osteoporosis Mother   . Diabetes Father   .  Parkinson's disease Father   . Cancer Brother        melanoma  . Asthma Brother    Allergies  Allergen Reactions  . Penicillins     Patient Care Team: Jerrol Banana., MD as PCP - General (Family Medicine)   Medications: Outpatient Medications Prior to Visit  Medication Sig  . estradiol (ESTRACE) 0.5 MG tablet TAKE 1 TABLET BY MOUTH  EVERY DAY  . sertraline (ZOLOFT) 50 MG tablet TAKE 1 TABLET BY MOUTH  DAILY  . tolterodine (DETROL LA) 4 MG 24 hr capsule   . tolterodine (DETROL LA) 4 MG 24 hr capsule TAKE 1 CAPSULE BY MOUTH  DAILY   No facility-administered medications prior to visit.    Review of Systems  All other systems reviewed and are negative.      Objective    BP (!) 123/51   Pulse 65   Temp 98 F (36.7 C)   Resp 16   Ht 5\' 3"  (1.6 m)   Wt 146 lb (66.2 kg)   BMI 25.86 kg/m  BP Readings from Last 3 Encounters:  12/24/19 (!) 123/51  12/20/18 119/73  01/31/18 110/68   Wt Readings from  Last 3 Encounters:  12/24/19 146 lb (66.2 kg)  12/20/18 145 lb (65.8 kg)  01/31/18 137 lb (62.1 kg)      Physical Exam Vitals reviewed.  Constitutional:      Appearance: Normal appearance.  HENT:     Right Ear: Tympanic membrane normal.     Left Ear: Tympanic membrane normal.  Eyes:     Pupils: Pupils are equal, round, and reactive to light.  Cardiovascular:     Rate and Rhythm: Normal rate and regular rhythm.     Pulses: Normal pulses.     Heart sounds: Normal heart sounds.  Pulmonary:     Effort: Pulmonary effort is normal.     Breath sounds: Normal breath sounds.  Chest:     Breasts:        Right: Normal. No swelling, bleeding, inverted nipple, mass, nipple discharge, skin change or tenderness.        Left: Normal. No swelling, bleeding, inverted nipple, mass, nipple discharge, skin change or tenderness.  Abdominal:     General: Bowel sounds are normal.  Genitourinary:    General: Normal vulva.     Rectum: Normal. Guaiac result negative.  Musculoskeletal:     Cervical back: Normal range of motion and neck supple.  Skin:    General: Skin is warm and dry.     Comments: Very fair skin  Neurological:     General: No focal deficit present.     Mental Status: She is alert and oriented to person, place, and time.  Psychiatric:        Mood and Affect: Mood normal.        Behavior: Behavior normal.        Thought Content: Thought content normal.        Judgment: Judgment normal.       Last depression screening scores PHQ 2/9 Scores 12/24/2019 12/20/2018 04/12/2017  PHQ - 2 Score 1 1 0  PHQ- 9 Score - 5 -   Last fall risk screening Fall Risk  12/24/2019  Falls in the past year? 0  Number falls in past yr: 0  Injury with Fall? 0  Risk for fall due to : No Fall Risks  Follow up Falls evaluation completed   Last Audit-C alcohol use screening Alcohol  Use Disorder Test (AUDIT) 12/24/2019  1. How often do you have a drink containing alcohol? 3  2. How many drinks  containing alcohol do you have on a typical day when you are drinking? 0  3. How often do you have six or more drinks on one occasion? 0  AUDIT-C Score 3  4. How often during the last year have you found that you were not able to stop drinking once you had started? 0  5. How often during the last year have you failed to do what was normally expected from you because of drinking? 0  6. How often during the last year have you needed a first drink in the morning to get yourself going after a heavy drinking session? 0  7. How often during the last year have you had a feeling of guilt of remorse after drinking? 0  8. How often during the last year have you been unable to remember what happened the night before because you had been drinking? 0  9. Have you or someone else been injured as a result of your drinking? 0  10. Has a relative or friend or a doctor or another health worker been concerned about your drinking or suggested you cut down? 0  Alcohol Use Disorder Identification Test Final Score (AUDIT) 3  Alcohol Brief Interventions/Follow-up AUDIT Score <7 follow-up not indicated   A score of 3 or more in women, and 4 or more in men indicates increased risk for alcohol abuse, EXCEPT if all of the points are from question 1   No results found for any visits on 12/24/19.  Assessment & Plan    Routine Health Maintenance and Physical Exam  Exercise Activities and Dietary recommendations Goals   None     Immunization History  Administered Date(s) Administered  . Influenza,inj,Quad PF,6+ Mos 01/24/2017, 01/31/2018, 12/07/2018  . Td 01/24/2017  . Tdap 04/22/2006    Health Maintenance  Topic Date Due  . HIV Screening  Never done  . PAP SMEAR-Modifier  12/11/2018  . INFLUENZA VACCINE  09/23/2019  . MAMMOGRAM  07/23/2021  . COLONOSCOPY  12/28/2023  . TETANUS/TDAP  01/25/2027  . Hepatitis C Screening  Completed    Discussed health benefits of physical activity, and encouraged her to  engage in regular exercise appropriate for her age and condition. 1. Annual physical exam Last Pap smear done today.  Colonoscopy 2025. - Comprehensive Metabolic Panel (CMET) - CBC w/Diff/Platelet - Lipid panel - TSH  2. Need for immunization against influenza  - Flu Vaccine QUAD 6+ mos PF IM (Fluarix Quad PF)  3. Cervical cancer screening Last Pap performed today. - Cytology - PAP  4. Need for shingles vaccine  - Varicella-zoster vaccine IM (Shingrix)   No follow-ups on file.        Verba Ainley Cranford Mon, MD  [facility] [phone] (phone) [phone] (fax)  [place]

## 2019-12-26 LAB — CYTOLOGY - PAP
Comment: NEGATIVE
Diagnosis: NEGATIVE
High risk HPV: NEGATIVE

## 2020-02-07 ENCOUNTER — Other Ambulatory Visit: Payer: Self-pay | Admitting: Family Medicine

## 2020-02-07 DIAGNOSIS — Z79890 Hormone replacement therapy (postmenopausal): Secondary | ICD-10-CM

## 2020-03-18 ENCOUNTER — Other Ambulatory Visit: Payer: Self-pay | Admitting: Family Medicine

## 2020-04-24 ENCOUNTER — Ambulatory Visit: Payer: Self-pay | Admitting: *Deleted

## 2020-04-24 NOTE — Telephone Encounter (Signed)
Patient is calling to report she has had a + COVID home test- patient has allergies and was attributing symptoms to seasonal allergies. Advised per COVID protocol- patient will call for virtual appointment if she feels her symptoms are not improving/getting worse.  Reason for Disposition . [1] [id number] diagnosed by positive lab test (e.g., PCR, rapid self-test kit) AND [2] mild symptoms (e.g., cough, fever, others) AND [9] no complications or SOB  Answer Assessment - Initial Assessment Questions 1. COVID-19 DIAGNOSIS: "Who made your COVID-19 diagnosis?" "Was it confirmed by a positive lab test or self-test?" If not diagnosed by a doctor (or NP/PA), ask "Are there lots of cases (community spread) where you live?" Note: See public health department website, if unsure.     Home test positive 2. COVID-19 EXPOSURE: "Was there any known exposure to COVID before the symptoms began?" CDC Definition of close contact: within 6 feet (2 meters) for a total of 15 minutes or more over a 24-hour period.      No known exposure 3. ONSET: "When did the COVID-19 symptoms start?"      Saturday 4. WORST SYMPTOM: "What is your worst symptom?" (e.g., cough, fever, shortness of breath, muscle aches)     cough 5. COUGH: "Do you have a cough?" If Yes, ask: "How bad is the cough?"       Yes- spells- phelm present 6. FEVER: "Do you have a fever?" If Yes, ask: "What is your temperature, how was it measured, and when did it start?"     no 7. RESPIRATORY STATUS: "Describe your breathing?" (e.g., shortness of breath, wheezing, unable to speak)      A little heaviness in chest 8. BETTER-SAME-WORSE: "Are you getting better, staying the same or getting worse compared to yesterday?"  If getting worse, ask, "In what way?"     same 9. HIGH RISK DISEASE: "Do you have any chronic medical problems?" (e.g., asthma, heart or lung disease, weak immune system, obesity, etc.)     no 10. VACCINE: "Have you had the COVID-19 vaccine?" If  Yes, ask: "Which one, how many shots, when did you get it?"       Pfizer- 2 doses 11. BOOSTER: "Have you received your COVID-19 booster?" If Yes, ask: "Which one and when did you get it?"       No booster 12. PREGNANCY: "Is there any chance you are pregnant?" "When was your last menstrual period?"       n/a 13. OTHER SYMPTOMS: "Do you have any other symptoms?"  (e.g., chills, fatigue, headache, loss of smell or taste, muscle pain, sore throat)       fatigue 14. O2 SATURATION MONITOR:  "Do you use an oxygen saturation monitor (pulse oximeter) at home?" If Yes, ask "What is your reading (oxygen level) today?" "What is your usual oxygen saturation reading?" (e.g., 95%)       no  Protocols used: CORONAVIRUS (COVID-19) DIAGNOSED OR SUSPECTED-A-AH

## 2020-06-24 ENCOUNTER — Other Ambulatory Visit: Payer: Self-pay | Admitting: Family Medicine

## 2020-06-24 DIAGNOSIS — Z1231 Encounter for screening mammogram for malignant neoplasm of breast: Secondary | ICD-10-CM

## 2020-07-24 ENCOUNTER — Ambulatory Visit
Admission: RE | Admit: 2020-07-24 | Discharge: 2020-07-24 | Disposition: A | Discharge status: Home / Self Care | Payer: Managed Care, Other (non HMO) | Source: Ambulatory Visit | Attending: Family Medicine | Admitting: Family Medicine

## 2020-07-24 ENCOUNTER — Other Ambulatory Visit: Payer: Self-pay

## 2020-07-24 DIAGNOSIS — Z1231 Encounter for screening mammogram for malignant neoplasm of breast: Secondary | ICD-10-CM | POA: Insufficient documentation

## 2020-07-28 ENCOUNTER — Other Ambulatory Visit: Payer: Self-pay | Admitting: Family Medicine

## 2020-09-07 ENCOUNTER — Other Ambulatory Visit: Payer: Self-pay | Admitting: Family Medicine

## 2020-09-08 NOTE — Telephone Encounter (Signed)
Please advised refill.  LOV: 12/24/2019 NOV:12/24/2020 Last refill: 02/07/2020 #90 with 1/refill

## 2020-09-08 NOTE — Telephone Encounter (Signed)
  Notes to clinic:  Patient has appt on 12/24/2020 Review for refill until that time    Requested Prescriptions  Pending Prescriptions Disp Refills   sertraline (ZOLOFT) 50 MG tablet [Pharmacy Med Name: Sertraline HCl 50 MG Oral Tablet] 90 tablet 3    Sig: TAKE 1 TABLET BY MOUTH  DAILY      Psychiatry:  Antidepressants - SSRI Failed - 09/07/2020  7:44 PM      Failed - Valid encounter within last 6 months    Recent Outpatient Visits           8 months ago Annual physical exam   [facility] Jerrol Banana., MD   1 year ago Annual physical exam   [facility] Jerrol Banana., MD   2 years ago Other depression   [facility] Jerrol Banana., MD   3 years ago Postmenopausal HRT (hormone replacement therapy)   [facility] Jerrol Banana., MD   3 years ago Annual physical exam   [facility] Jerrol Banana., MD       Future Appointments             In 3 months Jerrol Banana., MD [facility]             Passed - Completed PHQ-2 or PHQ-9 in the last 360 days

## 2020-12-24 ENCOUNTER — Encounter: Payer: Self-pay | Admitting: Family Medicine

## 2021-08-12 DIAGNOSIS — J029 Acute pharyngitis, unspecified: Secondary | ICD-10-CM | POA: Diagnosis not present

## 2021-10-09 ENCOUNTER — Telehealth: Payer: Self-pay

## 2021-10-09 NOTE — Telephone Encounter (Signed)
Please advise referral?  

## 2021-10-09 NOTE — Telephone Encounter (Signed)
Copied from [place] [id number]. Topic: Referral - Request for Referral >> Oct 09, 2021 11:09 AM Everette C wrote: Has patient seen PCP for this complaint? Yes.   *If NO, is insurance requiring patient see PCP for this issue before PCP can refer them? Referral for which specialty: Dermatology  Preferred provider/office: [facility] - Dr. Harriett Sine, MD Reason for referral: routine skin check   Patient has already scheduled their appt for 10/22/21 at 12 PM and would like referral prior to that to avoid rescheduling if possible

## 2021-10-13 ENCOUNTER — Telehealth: Payer: Self-pay | Admitting: Family Medicine

## 2021-10-13 ENCOUNTER — Other Ambulatory Visit: Payer: Self-pay

## 2021-10-13 DIAGNOSIS — Z79890 Hormone replacement therapy (postmenopausal): Secondary | ICD-10-CM

## 2021-10-13 MED ORDER — SERTRALINE HCL 50 MG PO TABS: 50.0000 mg | ORAL_TABLET | Freq: Every day | ORAL | 0 refills | Status: DC

## 2021-10-13 MED ORDER — ESTRADIOL 0.5 MG PO TABS: 0.5000 mg | ORAL_TABLET | Freq: Every day | ORAL | 0 refills | Status: DC

## 2021-10-13 NOTE — Telephone Encounter (Signed)
[facility] faxed refill request for the following medications:  sertraline (ZOLOFT) 50 MG tablet   estradiol (ESTRACE) 0.5 MG tablet   Please advise.

## 2021-10-14 ENCOUNTER — Other Ambulatory Visit: Payer: Self-pay | Admitting: Family Medicine

## 2021-10-14 DIAGNOSIS — Z1231 Encounter for screening mammogram for malignant neoplasm of breast: Secondary | ICD-10-CM

## 2021-10-15 ENCOUNTER — Other Ambulatory Visit: Payer: Self-pay

## 2021-10-15 DIAGNOSIS — Z79890 Hormone replacement therapy (postmenopausal): Secondary | ICD-10-CM

## 2021-10-15 MED ORDER — ESTRADIOL 0.5 MG PO TABS: 0.5000 mg | ORAL_TABLET | Freq: Every day | ORAL | 0 refills | Status: AC

## 2021-10-15 MED ORDER — SERTRALINE HCL 50 MG PO TABS: 50.0000 mg | ORAL_TABLET | Freq: Every day | ORAL | 0 refills | Status: AC

## 2021-10-15 NOTE — Telephone Encounter (Signed)
We received another refill request for these medications form [place].  It looks like they were sent to [facility].  Can you please sent to correct pharmacy.

## 2021-10-22 DIAGNOSIS — D2272 Melanocytic nevi of left lower limb, including hip: Secondary | ICD-10-CM | POA: Diagnosis not present

## 2021-10-22 DIAGNOSIS — Z85820 Personal history of malignant melanoma of skin: Secondary | ICD-10-CM | POA: Diagnosis not present

## 2021-10-22 DIAGNOSIS — L814 Other melanin hyperpigmentation: Secondary | ICD-10-CM | POA: Diagnosis not present

## 2021-10-22 DIAGNOSIS — L821 Other seborrheic keratosis: Secondary | ICD-10-CM | POA: Diagnosis not present

## 2021-10-22 DIAGNOSIS — D225 Melanocytic nevi of trunk: Secondary | ICD-10-CM | POA: Diagnosis not present

## 2021-10-22 DIAGNOSIS — D2271 Melanocytic nevi of right lower limb, including hip: Secondary | ICD-10-CM | POA: Diagnosis not present

## 2021-11-05 DIAGNOSIS — H43813 Vitreous degeneration, bilateral: Secondary | ICD-10-CM | POA: Diagnosis not present

## 2021-11-05 DIAGNOSIS — H2513 Age-related nuclear cataract, bilateral: Secondary | ICD-10-CM | POA: Diagnosis not present

## 2021-11-05 DIAGNOSIS — H52223 Regular astigmatism, bilateral: Secondary | ICD-10-CM | POA: Diagnosis not present

## 2021-11-05 DIAGNOSIS — H524 Presbyopia: Secondary | ICD-10-CM | POA: Diagnosis not present

## 2021-11-06 ENCOUNTER — Ambulatory Visit
Admission: RE | Admit: 2021-11-06 | Discharge: 2021-11-06 | Disposition: A | Discharge status: Home / Self Care | Payer: Medicare HMO | Source: Ambulatory Visit | Attending: Family Medicine | Admitting: Family Medicine

## 2021-11-06 DIAGNOSIS — Z1231 Encounter for screening mammogram for malignant neoplasm of breast: Secondary | ICD-10-CM | POA: Diagnosis not present

## 2021-12-14 ENCOUNTER — Other Ambulatory Visit: Payer: Self-pay | Admitting: Family Medicine

## 2021-12-14 DIAGNOSIS — Z79890 Hormone replacement therapy (postmenopausal): Secondary | ICD-10-CM

## 2021-12-30 ENCOUNTER — Encounter: Payer: Managed Care, Other (non HMO) | Admitting: Family Medicine

## 2022-03-31 DIAGNOSIS — Z0000 Encounter for general adult medical examination without abnormal findings: Secondary | ICD-10-CM | POA: Diagnosis not present

## 2022-03-31 DIAGNOSIS — Z13820 Encounter for screening for osteoporosis: Secondary | ICD-10-CM | POA: Diagnosis not present

## 2022-03-31 DIAGNOSIS — Z1211 Encounter for screening for malignant neoplasm of colon: Secondary | ICD-10-CM | POA: Diagnosis not present

## 2022-03-31 DIAGNOSIS — F418 Other specified anxiety disorders: Secondary | ICD-10-CM | POA: Diagnosis not present

## 2022-03-31 DIAGNOSIS — Z7185 Encounter for immunization safety counseling: Secondary | ICD-10-CM | POA: Diagnosis not present

## 2022-03-31 DIAGNOSIS — Z13220 Encounter for screening for lipoid disorders: Secondary | ICD-10-CM | POA: Diagnosis not present

## 2022-03-31 DIAGNOSIS — M19041 Primary osteoarthritis, right hand: Secondary | ICD-10-CM | POA: Diagnosis not present

## 2022-03-31 DIAGNOSIS — M19042 Primary osteoarthritis, left hand: Secondary | ICD-10-CM | POA: Diagnosis not present

## 2022-03-31 DIAGNOSIS — Z1231 Encounter for screening mammogram for malignant neoplasm of breast: Secondary | ICD-10-CM | POA: Diagnosis not present

## 2022-04-07 DIAGNOSIS — Z0000 Encounter for general adult medical examination without abnormal findings: Secondary | ICD-10-CM | POA: Diagnosis not present

## 2022-04-07 DIAGNOSIS — Z136 Encounter for screening for cardiovascular disorders: Secondary | ICD-10-CM | POA: Diagnosis not present

## 2022-04-07 DIAGNOSIS — M19041 Primary osteoarthritis, right hand: Secondary | ICD-10-CM | POA: Diagnosis not present

## 2022-04-27 DIAGNOSIS — Z780 Asymptomatic menopausal state: Secondary | ICD-10-CM | POA: Diagnosis not present

## 2022-04-27 DIAGNOSIS — M85851 Other specified disorders of bone density and structure, right thigh: Secondary | ICD-10-CM | POA: Diagnosis not present

## 2022-04-27 DIAGNOSIS — M85852 Other specified disorders of bone density and structure, left thigh: Secondary | ICD-10-CM | POA: Diagnosis not present

## 2022-06-01 DIAGNOSIS — E663 Overweight: Secondary | ICD-10-CM | POA: Diagnosis not present

## 2022-06-01 DIAGNOSIS — M2560 Stiffness of unspecified joint, not elsewhere classified: Secondary | ICD-10-CM | POA: Diagnosis not present

## 2022-06-01 DIAGNOSIS — M79642 Pain in left hand: Secondary | ICD-10-CM | POA: Diagnosis not present

## 2022-06-01 DIAGNOSIS — M1991 Primary osteoarthritis, unspecified site: Secondary | ICD-10-CM | POA: Diagnosis not present

## 2022-06-01 DIAGNOSIS — M2540 Effusion, unspecified joint: Secondary | ICD-10-CM | POA: Diagnosis not present

## 2022-06-01 DIAGNOSIS — M79641 Pain in right hand: Secondary | ICD-10-CM | POA: Diagnosis not present

## 2022-06-01 DIAGNOSIS — Z6825 Body mass index (BMI) 25.0-25.9, adult: Secondary | ICD-10-CM | POA: Diagnosis not present

## 2022-06-17 DIAGNOSIS — Z6825 Body mass index (BMI) 25.0-25.9, adult: Secondary | ICD-10-CM | POA: Diagnosis not present

## 2022-06-17 DIAGNOSIS — E663 Overweight: Secondary | ICD-10-CM | POA: Diagnosis not present

## 2022-06-17 DIAGNOSIS — M1991 Primary osteoarthritis, unspecified site: Secondary | ICD-10-CM | POA: Diagnosis not present

## 2022-06-17 DIAGNOSIS — M79642 Pain in left hand: Secondary | ICD-10-CM | POA: Diagnosis not present

## 2022-06-17 DIAGNOSIS — M154 Erosive (osteo)arthritis: Secondary | ICD-10-CM | POA: Diagnosis not present

## 2022-06-17 DIAGNOSIS — M2560 Stiffness of unspecified joint, not elsewhere classified: Secondary | ICD-10-CM | POA: Diagnosis not present

## 2022-06-17 DIAGNOSIS — M2540 Effusion, unspecified joint: Secondary | ICD-10-CM | POA: Diagnosis not present

## 2022-06-17 DIAGNOSIS — M79641 Pain in right hand: Secondary | ICD-10-CM | POA: Diagnosis not present

## 2022-06-17 DIAGNOSIS — M0609 Rheumatoid arthritis without rheumatoid factor, multiple sites: Secondary | ICD-10-CM | POA: Diagnosis not present

## 2022-06-30 DIAGNOSIS — F32A Depression, unspecified: Secondary | ICD-10-CM | POA: Diagnosis not present

## 2022-06-30 DIAGNOSIS — Z23 Encounter for immunization: Secondary | ICD-10-CM | POA: Diagnosis not present

## 2022-06-30 DIAGNOSIS — M1380 Other specified arthritis, unspecified site: Secondary | ICD-10-CM | POA: Diagnosis not present

## 2022-08-13 DIAGNOSIS — M545 Low back pain, unspecified: Secondary | ICD-10-CM | POA: Diagnosis not present

## 2022-08-13 DIAGNOSIS — Z8739 Personal history of other diseases of the musculoskeletal system and connective tissue: Secondary | ICD-10-CM | POA: Diagnosis not present

## 2022-09-09 DIAGNOSIS — M41125 Adolescent idiopathic scoliosis, thoracolumbar region: Secondary | ICD-10-CM | POA: Diagnosis not present

## 2022-09-09 DIAGNOSIS — M47816 Spondylosis without myelopathy or radiculopathy, lumbar region: Secondary | ICD-10-CM | POA: Diagnosis not present

## 2022-09-11 IMAGING — MG MM DIGITAL SCREENING BILAT W/ TOMO AND CAD
8 series · 8 of 24 positions shown · non-contrast
Comparison: Previous exam(s).

CLINICAL DATA: Screening.

EXAM:
DIGITAL SCREENING BILATERAL MAMMOGRAM WITH TOMOSYNTHESIS AND CAD
TECHNIQUE: Bilateral screening digital craniocaudal and mediolateral oblique
mammograms were obtained. Bilateral screening digital breast
tomosynthesis was performed. The images were evaluated with
computer-aided detection.

[R MLO synth-2D]
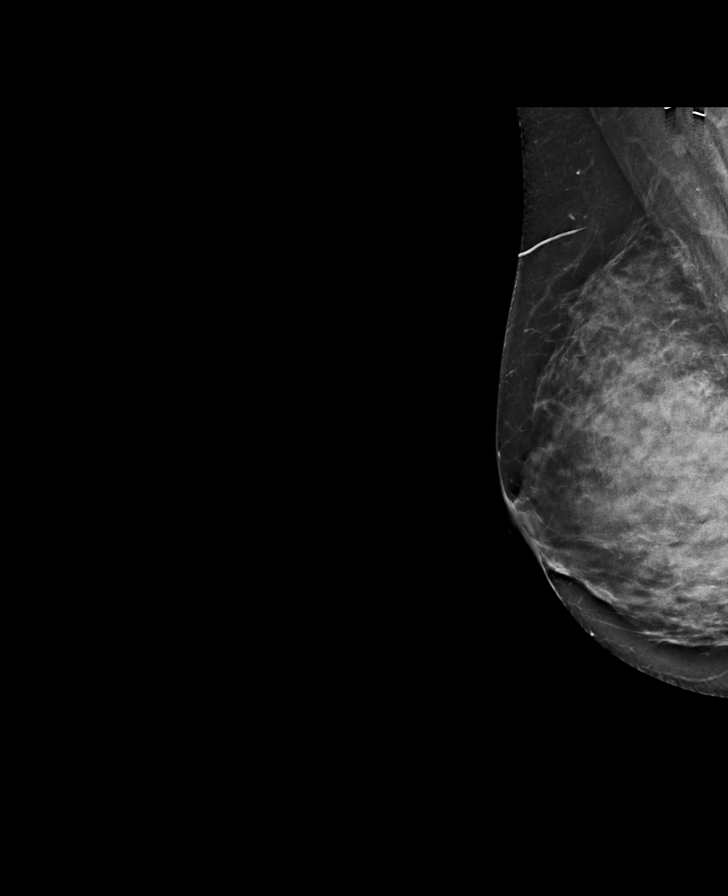

[L MLO synth-2D]
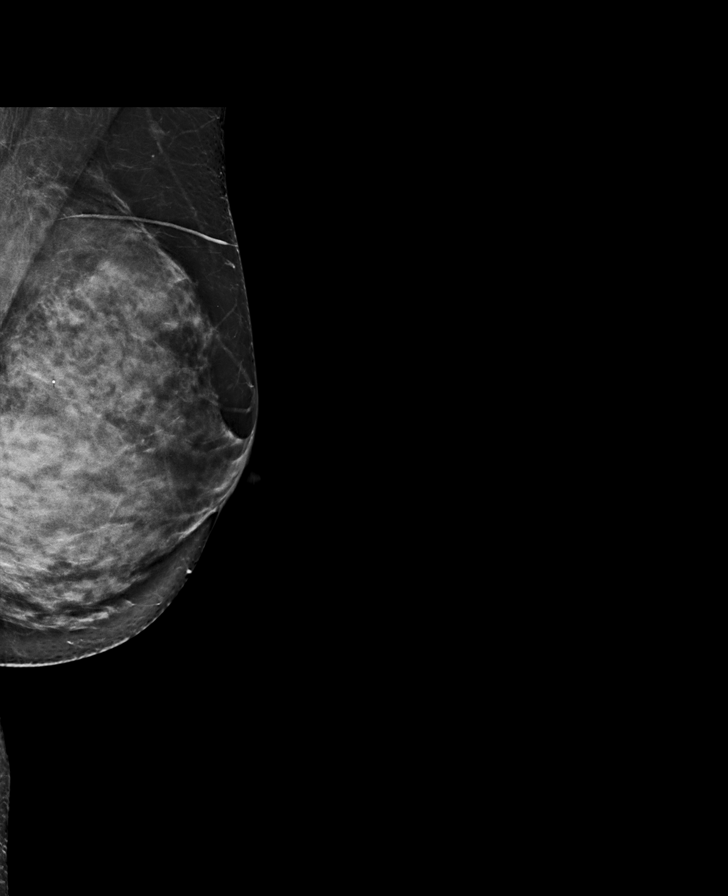

[L CC synth-2D]
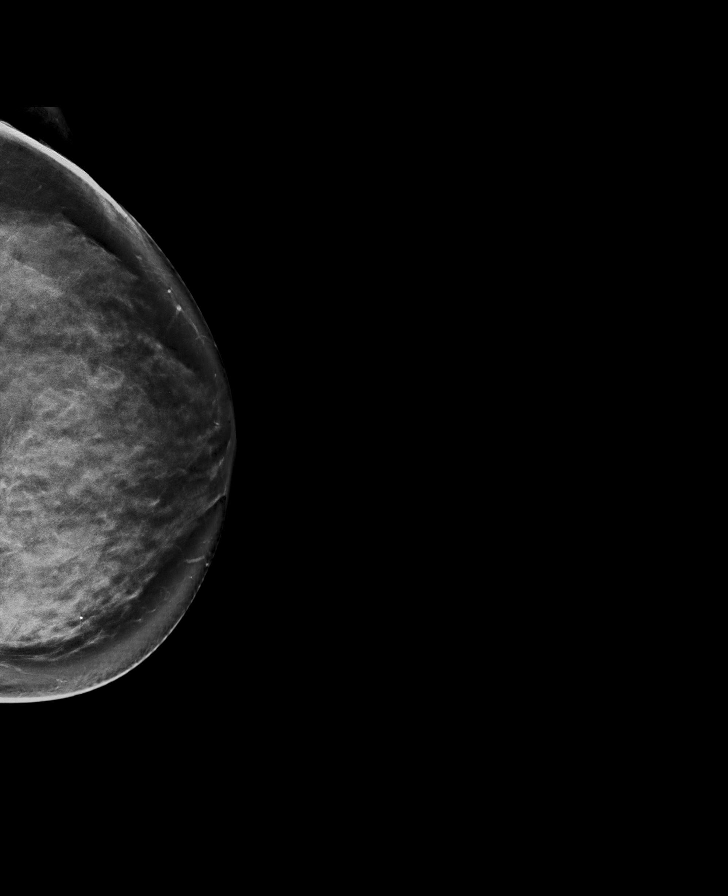

[R CC synth-2D]
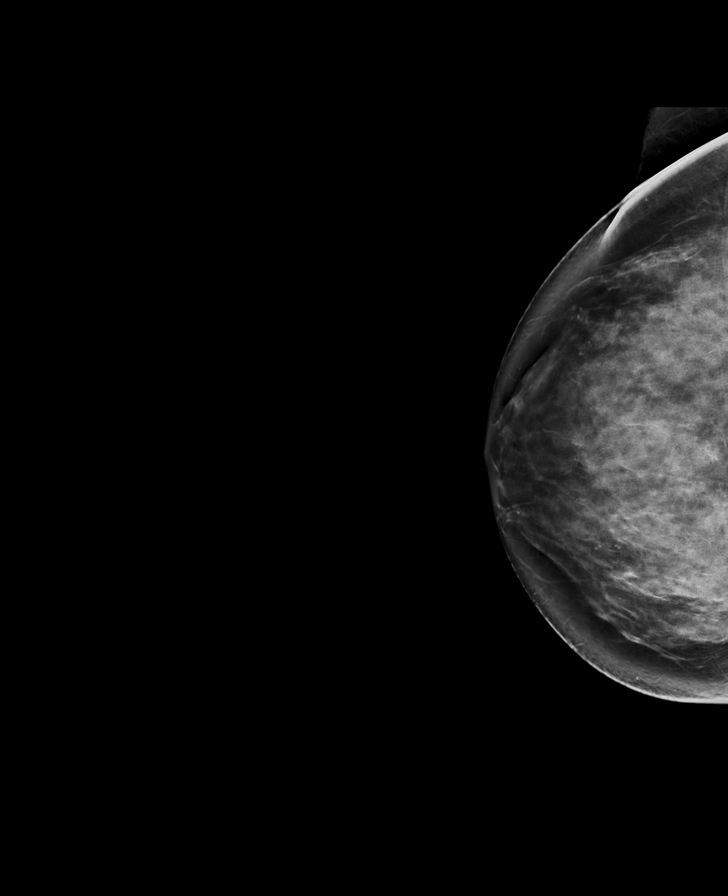

[R MLO tomo · tomo slice 41/81.0]
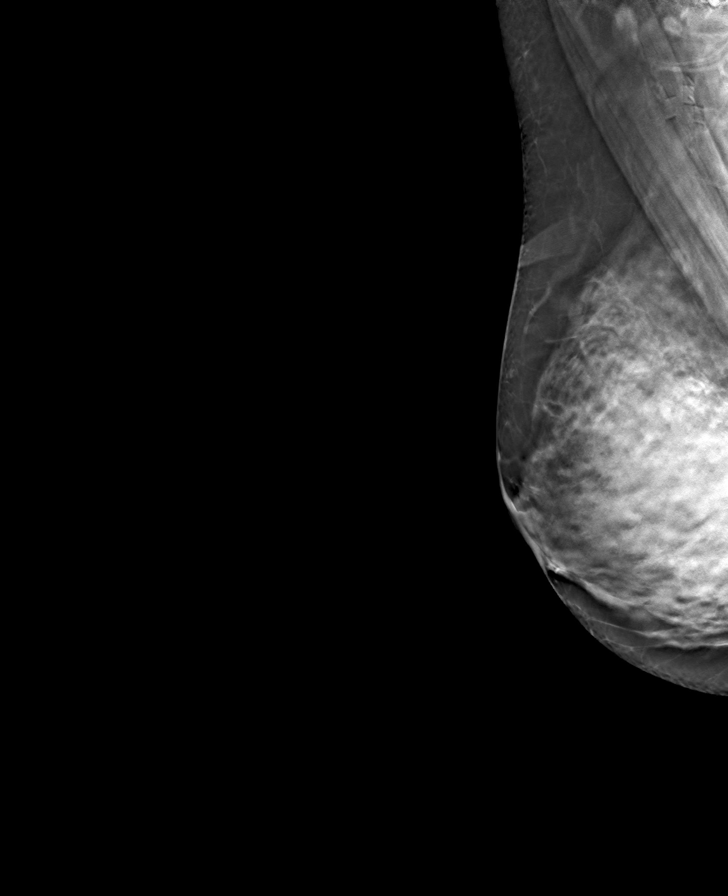

[L MLO tomo · tomo slice 39/77.0]
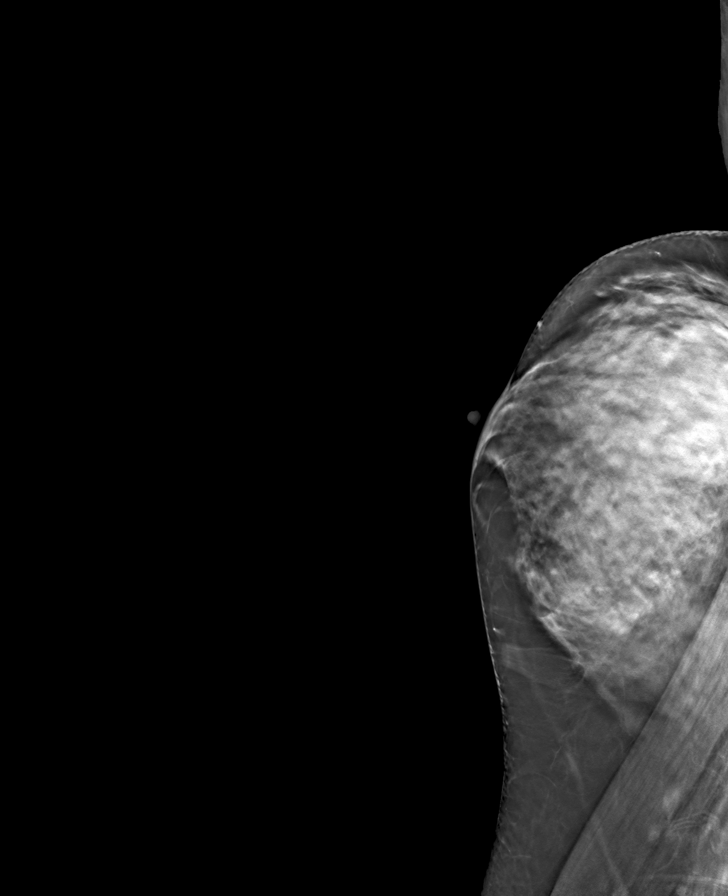

[R CC tomo · tomo slice 43/84.0]
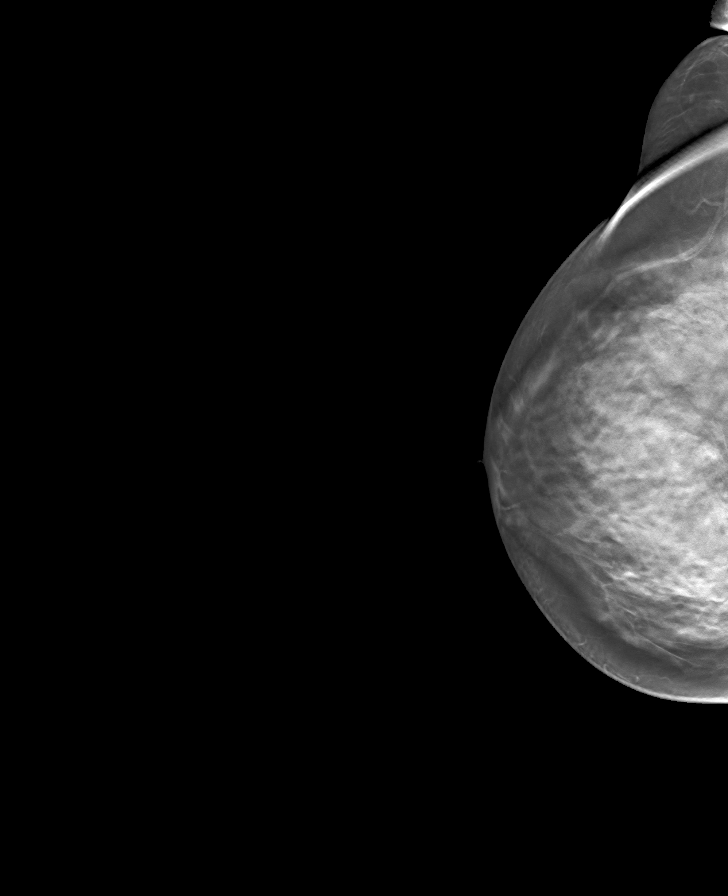

[L CC tomo · tomo slice 43/86.0]
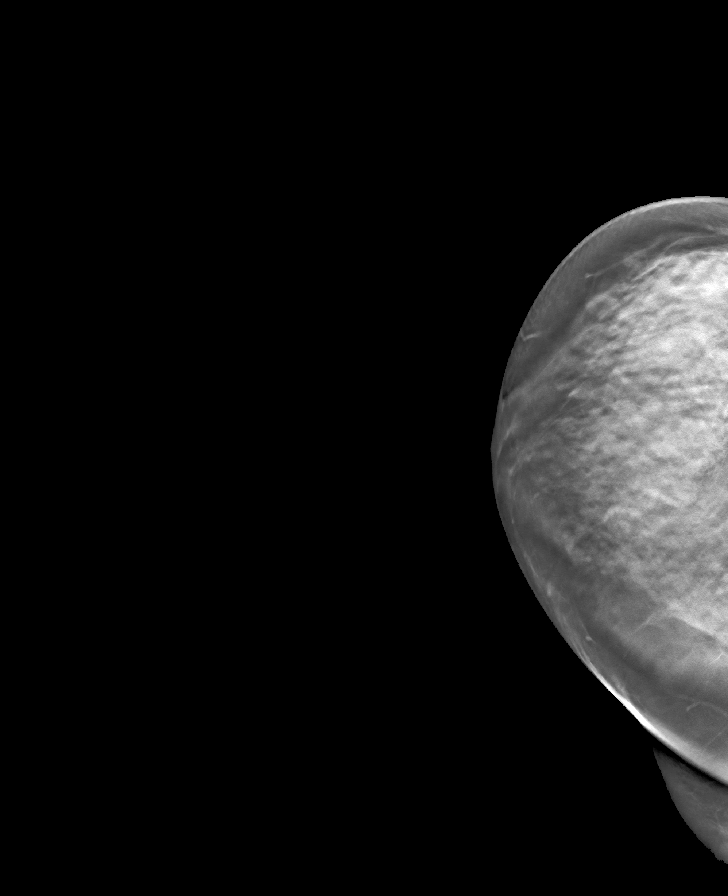

[8 of 24 positions shown; findings below may reference images not displayed]

ACR Breast Density Category d: The breast tissue is extremely dense,
which lowers the sensitivity of mammography
FINDINGS: There are no findings suspicious for malignancy. The images were
evaluated with computer-aided detection.
IMPRESSION: No mammographic evidence of malignancy. A result letter of this
screening mammogram will be mailed directly to the patient.

RECOMMENDATION:
Screening mammogram in one year. (Code:95-0-E9V)

BI-RADS CATEGORY  1: Negative.

## 2022-09-16 DIAGNOSIS — M2560 Stiffness of unspecified joint, not elsewhere classified: Secondary | ICD-10-CM | POA: Diagnosis not present

## 2022-09-16 DIAGNOSIS — M79641 Pain in right hand: Secondary | ICD-10-CM | POA: Diagnosis not present

## 2022-09-16 DIAGNOSIS — M2540 Effusion, unspecified joint: Secondary | ICD-10-CM | POA: Diagnosis not present

## 2022-09-16 DIAGNOSIS — E663 Overweight: Secondary | ICD-10-CM | POA: Diagnosis not present

## 2022-09-16 DIAGNOSIS — M79642 Pain in left hand: Secondary | ICD-10-CM | POA: Diagnosis not present

## 2022-09-16 DIAGNOSIS — M0609 Rheumatoid arthritis without rheumatoid factor, multiple sites: Secondary | ICD-10-CM | POA: Diagnosis not present

## 2022-09-16 DIAGNOSIS — Z6826 Body mass index (BMI) 26.0-26.9, adult: Secondary | ICD-10-CM | POA: Diagnosis not present

## 2022-09-16 DIAGNOSIS — M1991 Primary osteoarthritis, unspecified site: Secondary | ICD-10-CM | POA: Diagnosis not present

## 2022-10-21 DIAGNOSIS — L814 Other melanin hyperpigmentation: Secondary | ICD-10-CM | POA: Diagnosis not present

## 2022-10-21 DIAGNOSIS — D2272 Melanocytic nevi of left lower limb, including hip: Secondary | ICD-10-CM | POA: Diagnosis not present

## 2022-10-21 DIAGNOSIS — D2271 Melanocytic nevi of right lower limb, including hip: Secondary | ICD-10-CM | POA: Diagnosis not present

## 2022-10-21 DIAGNOSIS — L821 Other seborrheic keratosis: Secondary | ICD-10-CM | POA: Diagnosis not present

## 2022-10-21 DIAGNOSIS — D225 Melanocytic nevi of trunk: Secondary | ICD-10-CM | POA: Diagnosis not present

## 2022-10-21 DIAGNOSIS — Z85820 Personal history of malignant melanoma of skin: Secondary | ICD-10-CM | POA: Diagnosis not present

## 2022-11-08 DIAGNOSIS — H2513 Age-related nuclear cataract, bilateral: Secondary | ICD-10-CM | POA: Diagnosis not present

## 2022-11-08 DIAGNOSIS — H52223 Regular astigmatism, bilateral: Secondary | ICD-10-CM | POA: Diagnosis not present

## 2022-11-08 DIAGNOSIS — H43813 Vitreous degeneration, bilateral: Secondary | ICD-10-CM | POA: Diagnosis not present

## 2022-11-08 DIAGNOSIS — H524 Presbyopia: Secondary | ICD-10-CM | POA: Diagnosis not present

## 2022-11-11 DIAGNOSIS — Z1231 Encounter for screening mammogram for malignant neoplasm of breast: Secondary | ICD-10-CM | POA: Diagnosis not present

## 2022-11-13 LAB — AMB RESULTS CONSOLE CBG: Glucose: 97

## 2022-12-08 ENCOUNTER — Ambulatory Visit
Payer: Medicare HMO | Attending: Student in an Organized Health Care Education/Training Program | Admitting: Physical Therapy

## 2022-12-08 ENCOUNTER — Encounter: Payer: Self-pay | Admitting: Physical Therapy

## 2022-12-08 ENCOUNTER — Other Ambulatory Visit: Payer: Self-pay

## 2022-12-08 DIAGNOSIS — M62830 Muscle spasm of back: Secondary | ICD-10-CM | POA: Insufficient documentation

## 2022-12-08 DIAGNOSIS — R293 Abnormal posture: Secondary | ICD-10-CM | POA: Diagnosis not present

## 2022-12-08 DIAGNOSIS — M5459 Other low back pain: Secondary | ICD-10-CM | POA: Diagnosis not present

## 2022-12-08 NOTE — Therapy (Signed)
OUTPATIENT PHYSICAL THERAPY THORACOLUMBAR EVALUATION   Patient Name: Jasmin Hester MRN: [id number] DOB:[birth year], 67 y.o., female Today's Date: 12/08/2022  END OF SESSION:  PT End of Session - 12/08/22 1427     Visit Number 1    Number of Visits 12    Date for PT Re-Evaluation 01/19/23    Authorization Type FOTO.    PT Start Time 0152    PT Stop Time 0250    PT Time Calculation (min) 58 min    Activity Tolerance Patient tolerated treatment well    Behavior During Therapy [facility] for tasks assessed/performed             Past Medical History:  Diagnosis Date   Cancer (HCC)    melanoma 2007   Past Surgical History:  Procedure Laterality Date   BREAST EXCISIONAL BIOPSY Left 1993    negative results   BREAST EXCISIONAL BIOPSY Right 4098,1191    neg   Patient Active Problem List   Diagnosis Date Noted   Arthritis 12/09/2014   Cardiac murmur 12/09/2014   Neutropenia (HCC) 12/09/2014   Female climacteric state 12/09/2014   Stress incontinence 12/09/2014   Postmenopausal HRT (hormone replacement therapy) 09/12/2014   Avitaminosis D 08/07/2008   Idiopathic scoliosis 07/23/2008   Clinical depression 07/11/2008   H/O Malignant melanoma 07/11/2008   Restless leg 07/11/2008    REFERRING PROVIDER: Bernerd Pho MD  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  Rationale for Evaluation and Treatment: Rehabilitation  THERAPY DIAG:  Other low back pain - Plan: PT plan of care cert/re-cert  Abnormal posture - Plan: PT plan of care cert/re-cert  Muscle spasm of back - Plan: PT plan of care cert/re-cert  ONSET DATE: Ongoing.  SUBJECTIVE:                                                                                                                                                                                           SUBJECTIVE STATEMENT: The patient presents to the clinic with c/o chronic low back pain but is now affecting her quality of life due  to high pain-levels.  In fact, she was recently helping a friend which included replacing covers to outlets and the pain was very intense.  Upon presentation to the clinic today her pain was about a 3 but can easily go as high as a 7-8/10 with too much walking, standing or sitting.  She is finding it is getting more difficult getting around and doing her ADL's.  She does stretches and uses heat which helps some.  She also has some complaints of right hip pain.  PERTINENT HISTORY:  Neck pain, arthritis, RA, scoliosis since adolescents.  PAIN:  Are you having pain? Yes: NPRS scale: 3/10 Pain location: Low back. Pain description: Ache. Aggravating factors: As above. Relieving factors: As above.  PRECAUTIONS: None  RED FLAGS: None   WEIGHT BEARING RESTRICTIONS: No  FALLS:  Has patient fallen in last 6 months? 1 had a misstep.  Hit back.  Small scab in left low back region.  LIVING ENVIRONMENT: Lives with: lives with their spouse Lives in: House/apartment Has following equipment at home: None  OCCUPATION: Retired.  PLOF: Independent  PATIENT GOALS: Decrease pain, do more, improve quality of life.    OBJECTIVE:  Note: Objective measures were completed at Evaluation unless otherwise noted.   PATIENT SURVEYS:  FOTO 67.  POSTURE: rounded shoulders, forward head, and , scoliosis, right iliac crest higher.  PALPATION: Patient c/o pain across her lower lumbar region and pain "in" right hip.  LUMBAR ROM:  Active lumbar extension to 15 degrees. She exhibits full trunk but lumbar intervertebral motion is decreased by 50%.  LOWER EXTREMITY ROM:     WNL.  LOWER EXTREMITY MMT:    Normal bilateral LE strength.  LUMBAR SPECIAL TESTS:  Equal leg lengths. (-) SLR testing.  Positive right FABER test.   GAIT:  WNL.   TODAY'S TREATMENT:                                                                                                                              DATE: HMP and  IFC at 80-150 Hz on 40% scan x 20 minutes to patient's bilateral lower lumbar region.  Normal modality response following removal of modality.  PATIENT EDUCATION:    HOME EXERCISE PROGRAM:   ASSESSMENT:  CLINICAL IMPRESSION: The patient presents to OPPT with c/o chronic low back pain over many years but has become worse such that the pain is affected her quality of life and ability to perform ADL's like she would like to.  Her posture is remarkable for scoliosis.  She has active spinal range of motion limitations.  She exhibits normal LE strength and range of motion.  She has pain complaints across her lower lumbar region and increased muscular tone on the left and pain "in" her right hip.  She is very motivated to improve.  Patient will benefit from skilled physical therapy intervention to address pain and deficits.  OBJECTIVE IMPAIRMENTS: decreased activity tolerance, increased muscle spasms, postural dysfunction, and pain.   ACTIVITY LIMITATIONS: carrying, lifting, sitting, standing, and locomotion level  PARTICIPATION LIMITATIONS: cleaning  PERSONAL FACTORS: Time since onset of injury/illness/exacerbation and 1 comorbidity: scoliosis  are also affecting patient's functional outcome.   REHAB POTENTIAL: Good  CLINICAL DECISION MAKING: Evolving/moderate complexity  EVALUATION COMPLEXITY: Low   GOALS:  LONG TERM GOALS: Target date: 01/19/23  Ind with a HEP.  Goal status: INITIAL  2.  Stand 30 minutes with pain not > 3-4/10.  Goal status: INITIAL  3.  Sit 30  minutes with pain not > 3-4/10.   Goal status: INITIAL  4.  Perform ADL's with pain not > 3-4/10.  Goal status: INITIAL PLAN:  PT FREQUENCY: 2x/week  PT DURATION: 6 weeks  PLANNED INTERVENTIONS: 97110-Therapeutic exercises, 97530- Therapeutic activity, [id number]- Neuromuscular re-education, 97535- Self Care, [id number]- Manual therapy, 97014- Electrical stimulation (unattended), 97035- Ultrasound, Patient/Family education,  Dry Needling, Cryotherapy, and Moist heat.  PLAN FOR NEXT SESSION: Combo e'stim/US, STW/M, Core exercise progression, spinal protection techniques and body mechanics training.   Charda Janis, [place], PT 12/08/2022, 3:29 PM

## 2022-12-14 ENCOUNTER — Encounter: Payer: Self-pay | Admitting: *Deleted

## 2022-12-14 ENCOUNTER — Ambulatory Visit: Payer: Medicare HMO | Admitting: *Deleted

## 2022-12-14 DIAGNOSIS — R293 Abnormal posture: Secondary | ICD-10-CM | POA: Diagnosis not present

## 2022-12-14 DIAGNOSIS — M62830 Muscle spasm of back: Secondary | ICD-10-CM

## 2022-12-14 DIAGNOSIS — M5459 Other low back pain: Secondary | ICD-10-CM | POA: Diagnosis not present

## 2022-12-14 NOTE — Therapy (Signed)
OUTPATIENT PHYSICAL THERAPY THORACOLUMBAR TREATMENT   Patient Name: Jasmin Hester MRN: [id number] DOB:Dec 25, 1955, 67 y.o., female, female Today's Date: 12/14/2022  END OF SESSION:  PT End of Session - 12/14/22 1100     Visit Number 2    Number of Visits 12    Date for PT Re-Evaluation 01/19/23    Authorization Type FOTO.    PT Start Time 1101    PT Stop Time 1154    PT Time Calculation (min) 53 min             Past Medical History:  Diagnosis Date   Cancer (HCC)    melanoma 2007   Past Surgical History:  Procedure Laterality Date   BREAST EXCISIONAL BIOPSY Left 1993    negative results   BREAST EXCISIONAL BIOPSY Right 4098,1191    neg   Patient Active Problem List   Diagnosis Date Noted   Arthritis 12/09/2014   Cardiac murmur 12/09/2014   Neutropenia (HCC) 12/09/2014   Female climacteric state 12/09/2014   Stress incontinence 12/09/2014   Postmenopausal HRT (hormone replacement therapy) 09/12/2014   Avitaminosis D 08/07/2008   Idiopathic scoliosis 07/23/2008   Clinical depression 07/11/2008   H/O Malignant melanoma 07/11/2008   Restless leg 07/11/2008    REFERRING PROVIDER: Bernerd Pho MD  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  Rationale for Evaluation and Treatment: Rehabilitation  THERAPY DIAG:  Abnormal posture  Other low back pain  Muscle spasm of back  ONSET DATE: Ongoing.  SUBJECTIVE:                                                                                                                                                                                           SUBJECTIVE STATEMENT:  Pain 2-3/10 today   PERTINENT HISTORY:  Neck pain, arthritis, RA, scoliosis since adolescents.  PAIN:  Are you having pain? Yes: NPRS scale: 3/10 Pain location: Low back. Pain description: Ache. Aggravating factors: As above. Relieving factors: As above.  PRECAUTIONS: None  RED FLAGS: None   WEIGHT BEARING  RESTRICTIONS: No  FALLS:  Has patient fallen in last 6 months? 1 had a misstep.  Hit back.  Small scab in left low back region.  LIVING ENVIRONMENT: Lives with: lives with their spouse Lives in: House/apartment Has following equipment at home: None  OCCUPATION: Retired.  PLOF: Independent  PATIENT GOALS: Decrease pain, do more, improve quality of life.    OBJECTIVE:  Note: Objective measures were completed at Evaluation unless otherwise noted.   PATIENT SURVEYS:  FOTO 67.  POSTURE: rounded shoulders, forward head, and , scoliosis, right iliac crest higher.  PALPATION:  Patient c/o pain across her lower lumbar region and pain "in" right hip.  LUMBAR ROM:  Active lumbar extension to 15 degrees. She exhibits full trunk but lumbar intervertebral motion is decreased by 50%.  LOWER EXTREMITY ROM:     WNL.  LOWER EXTREMITY MMT:    Normal bilateral LE strength.  LUMBAR SPECIAL TESTS:  Equal leg lengths. (-) SLR testing.  Positive right FABER test.   GAIT:  WNL.   TODAY'S TREATMENT:                                                                                                                              DATE:                                            12/14/22                                    EXERCISE LOG  Exercise Repetitions and Resistance Comments  AB bracing X10 hold 5 secs   Nustep L3 x 12 mins   Dying bug X6 hold 10 each side   Bridging X 10 hold 5 secs   Cat/camel X 10    Blank cell = exercise not performed today  Discussed walking program and movement patterns to decrease pain triggers during ADL's STW/TPR to LT QL in RT side lying    PATIENT EDUCATION:    HOME EXERCISE PROGRAM:   ASSESSMENT:  CLINICAL IMPRESSION: Pt arrived to clinic today doing fairly well with 3/10 LBP. Rx focused on AB bracing and movement patterns to decrease pain triggers with ADL's as well as core activation exs. Handout for HEP given with sets and reps daily.  STW/TPR performed to LT QL with good release.     OBJECTIVE IMPAIRMENTS: decreased activity tolerance, increased muscle spasms, postural dysfunction, and pain.   ACTIVITY LIMITATIONS: carrying, lifting, sitting, standing, and locomotion level  PARTICIPATION LIMITATIONS: cleaning  PERSONAL FACTORS: Time since onset of injury/illness/exacerbation and 1 comorbidity: scoliosis  are also affecting patient's functional outcome.   REHAB POTENTIAL: Good  CLINICAL DECISION MAKING: Evolving/moderate complexity  EVALUATION COMPLEXITY: Low   GOALS:  LONG TERM GOALS: Target date: 01/19/23  Ind with a HEP.  Goal status: INITIAL  2.  Stand 30 minutes with pain not > 3-4/10.  Goal status: INITIAL  3.  Sit 30 minutes with pain not > 3-4/10.   Goal status: INITIAL  4.  Perform ADL's with pain not > 3-4/10.  Goal status: INITIAL PLAN:  PT FREQUENCY: 2x/week  PT DURATION: 6 weeks  PLANNED INTERVENTIONS: 97110-Therapeutic exercises, 97530- Therapeutic activity, [id number]- Neuromuscular re-education, 97535- Self Care, 82956- Manual therapy, 97014- Electrical stimulation (unattended), 97035- Ultrasound, Patient/Family education, Dry Needling, Cryotherapy, and Moist heat.  PLAN FOR NEXT  SESSION: Combo e'stim/US, STW/M, Core exercise progression, spinal protection techniques and body mechanics training.   Jadon Ressler,CHRIS, PTA 12/14/2022, 12:02 PM

## 2022-12-16 ENCOUNTER — Encounter: Payer: Self-pay | Admitting: *Deleted

## 2022-12-16 ENCOUNTER — Ambulatory Visit: Payer: Medicare HMO | Admitting: *Deleted

## 2022-12-16 DIAGNOSIS — M62830 Muscle spasm of back: Secondary | ICD-10-CM | POA: Diagnosis not present

## 2022-12-16 DIAGNOSIS — M2560 Stiffness of unspecified joint, not elsewhere classified: Secondary | ICD-10-CM | POA: Diagnosis not present

## 2022-12-16 DIAGNOSIS — E663 Overweight: Secondary | ICD-10-CM | POA: Diagnosis not present

## 2022-12-16 DIAGNOSIS — M5459 Other low back pain: Secondary | ICD-10-CM

## 2022-12-16 DIAGNOSIS — R293 Abnormal posture: Secondary | ICD-10-CM

## 2022-12-16 DIAGNOSIS — Z111 Encounter for screening for respiratory tuberculosis: Secondary | ICD-10-CM | POA: Diagnosis not present

## 2022-12-16 DIAGNOSIS — M79642 Pain in left hand: Secondary | ICD-10-CM | POA: Diagnosis not present

## 2022-12-16 DIAGNOSIS — M0609 Rheumatoid arthritis without rheumatoid factor, multiple sites: Secondary | ICD-10-CM | POA: Diagnosis not present

## 2022-12-16 DIAGNOSIS — M2540 Effusion, unspecified joint: Secondary | ICD-10-CM | POA: Diagnosis not present

## 2022-12-16 DIAGNOSIS — M154 Erosive (osteo)arthritis: Secondary | ICD-10-CM | POA: Diagnosis not present

## 2022-12-16 DIAGNOSIS — M79641 Pain in right hand: Secondary | ICD-10-CM | POA: Diagnosis not present

## 2022-12-16 DIAGNOSIS — Z6825 Body mass index (BMI) 25.0-25.9, adult: Secondary | ICD-10-CM | POA: Diagnosis not present

## 2022-12-16 NOTE — Therapy (Signed)
OUTPATIENT PHYSICAL THERAPY THORACOLUMBAR TREATMENT   Patient Name: Jasmin Hester MRN: [id number] DOB:06/24/1955, 67 y.o., female Today's Date: 12/16/2022  END OF SESSION:  PT End of Session - 12/16/22 0811     Visit Number 3    Number of Visits 12    Date for PT Re-Evaluation 01/19/23    PT Start Time 0800    PT Stop Time 0850    PT Time Calculation (min) 50 min             Past Medical History:  Diagnosis Date   Cancer (HCC)    melanoma 2007   Past Surgical History:  Procedure Laterality Date   BREAST EXCISIONAL BIOPSY Left 1993    negative results   BREAST EXCISIONAL BIOPSY Right 5621,3086    neg   Patient Active Problem List   Diagnosis Date Noted   Arthritis 12/09/2014   Cardiac murmur 12/09/2014   Neutropenia (HCC) 12/09/2014   Female climacteric state 12/09/2014   Stress incontinence 12/09/2014   Postmenopausal HRT (hormone replacement therapy) 09/12/2014   Avitaminosis D 08/07/2008   Idiopathic scoliosis 07/23/2008   Clinical depression 07/11/2008   H/O Malignant melanoma 07/11/2008   Restless leg 07/11/2008    REFERRING PROVIDER: Bernerd Pho MD  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  Rationale for Evaluation and Treatment: Rehabilitation  THERAPY DIAG:  Abnormal posture  Other low back pain  Muscle spasm of back  ONSET DATE: Ongoing.  SUBJECTIVE:                                                                                                                                                                                           SUBJECTIVE STATEMENT:  Pain 2-3/10 today. LT upper glute is sore   PERTINENT HISTORY:  Neck pain, arthritis, RA, scoliosis since adolescents.  PAIN:  Are you having pain? Yes: NPRS scale: 2-3/10 Pain location: Low back. Pain description: Ache. Aggravating factors: As above. Relieving factors: As above.  PRECAUTIONS: None  RED FLAGS: None   WEIGHT BEARING RESTRICTIONS:  No  FALLS:  Has patient fallen in last 6 months? 1 had a misstep.  Hit back.  Small scab in left low back region.  LIVING ENVIRONMENT: Lives with: lives with their spouse Lives in: House/apartment Has following equipment at home: None  OCCUPATION: Retired.  PLOF: Independent  PATIENT GOALS: Decrease pain, do more, improve quality of life.    OBJECTIVE:  Note: Objective measures were completed at Evaluation unless otherwise noted.   PATIENT SURVEYS:  FOTO 67.  POSTURE: rounded shoulders, forward head, and , scoliosis, right iliac crest higher.  PALPATION: Patient  c/o pain across her lower lumbar region and pain "in" right hip.  LUMBAR ROM:  Active lumbar extension to 15 degrees. She exhibits full trunk but lumbar intervertebral motion is decreased by 50%.  LOWER EXTREMITY ROM:     WNL.  LOWER EXTREMITY MMT:    Normal bilateral LE strength.  LUMBAR SPECIAL TESTS:  Equal leg lengths. (-) SLR testing.  Positive right FABER test.   GAIT:  WNL.   TODAY'S TREATMENT:                                                                                                                              DATE:                                            12/16/22  [place] combo x 10 mins at 1.5 w/cm2 to LT Glute and QL Rt side lying Manual STW/ TPR to LT upper glute and LT QL IFC x 15 mins 80-150hz   to upper glute/LB paras in RT side lying                                      EXERCISE LOG        12-14-22  Exercise Repetitions and Resistance Comments  AB bracing X10 hold 5 secs   Nustep L3 x 12 mins   Dying bug X6 hold 10 each side   Bridging X 10 hold 5 secs   Cat/camel X 10    Blank cell = exercise not performed today  Discussed walking program and movement patterns to decrease pain triggers during ADL's STW/TPR to LT QL in RT side lying    PATIENT EDUCATION:    HOME EXERCISE PROGRAM:   ASSESSMENT:  CLINICAL IMPRESSION: Pt arrived to clinic today doing fairly well  with 2-3/10 LBP. Rx focused decreasing Tp's/ mm tightness/pain LT side upper glute and QL using [place] combo, STW as well as IFC/ HMP and did fairly well with TP releases.     OBJECTIVE IMPAIRMENTS: decreased activity tolerance, increased muscle spasms, postural dysfunction, and pain.   ACTIVITY LIMITATIONS: carrying, lifting, sitting, standing, and locomotion level  PARTICIPATION LIMITATIONS: cleaning  PERSONAL FACTORS: Time since onset of injury/illness/exacerbation and 1 comorbidity: scoliosis  are also affecting patient's functional outcome.   REHAB POTENTIAL: Good  CLINICAL DECISION MAKING: Evolving/moderate complexity  EVALUATION COMPLEXITY: Low   GOALS:  LONG TERM GOALS: Target date: 01/19/23  Ind with a HEP.  Goal status: INITIAL  2.  Stand 30 minutes with pain not > 3-4/10.  Goal status: INITIAL  3.  Sit 30 minutes with pain not > 3-4/10.   Goal status: INITIAL  4.  Perform ADL's with pain not > 3-4/10.  Goal status: INITIAL PLAN:  PT FREQUENCY:  2x/week  PT DURATION: 6 weeks  PLANNED INTERVENTIONS: 97110-Therapeutic exercises, 97530- Therapeutic activity, [id number]- Neuromuscular re-education, 97535- Self Care, 81191- Manual therapy, 97014- Electrical stimulation (unattended), 97035- Ultrasound, Patient/Family education, Dry Needling, Cryotherapy, and Moist heat.  PLAN FOR NEXT SESSION: Combo e'stim/US, STW/M, Core exercise progression, spinal protection techniques and body mechanics training.   Molley Houser,CHRIS, PTA 12/16/2022, 11:58 AM

## 2022-12-20 ENCOUNTER — Ambulatory Visit: Payer: Medicare HMO

## 2022-12-20 DIAGNOSIS — R293 Abnormal posture: Secondary | ICD-10-CM | POA: Diagnosis not present

## 2022-12-20 DIAGNOSIS — M62830 Muscle spasm of back: Secondary | ICD-10-CM | POA: Diagnosis not present

## 2022-12-20 DIAGNOSIS — M5459 Other low back pain: Secondary | ICD-10-CM | POA: Diagnosis not present

## 2022-12-20 NOTE — Therapy (Signed)
OUTPATIENT PHYSICAL THERAPY THORACOLUMBAR TREATMENT   Patient Name: Jasmin Hester MRN: [id number] DOB:05-22-1955, 67 y.o., female Today's Date: 12/20/2022  END OF SESSION:  PT End of Session - 12/20/22 1303     Visit Number 4    Number of Visits 12    Date for PT Re-Evaluation 01/19/23    Authorization Type FOTO.    PT Start Time 1300    PT Stop Time 1359    PT Time Calculation (min) 59 min    Activity Tolerance Patient tolerated treatment well    Behavior During Therapy WFL for tasks assessed/performed             Past Medical History:  Diagnosis Date   Cancer (HCC)    melanoma 2007   Past Surgical History:  Procedure Laterality Date   BREAST EXCISIONAL BIOPSY Left 1993    negative results   BREAST EXCISIONAL BIOPSY Right 7829,5621    neg   Patient Active Problem List   Diagnosis Date Noted   Arthritis 12/09/2014   Cardiac murmur 12/09/2014   Neutropenia (HCC) 12/09/2014   Female climacteric state 12/09/2014   Stress incontinence 12/09/2014   Postmenopausal HRT (hormone replacement therapy) 09/12/2014   Avitaminosis D 08/07/2008   Idiopathic scoliosis 07/23/2008   Clinical depression 07/11/2008   H/O Malignant melanoma 07/11/2008   Restless leg 07/11/2008    REFERRING PROVIDER: Bernerd Pho MD  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  Rationale for Evaluation and Treatment: Rehabilitation  THERAPY DIAG:  Abnormal posture  Other low back pain  Muscle spasm of back  ONSET DATE: Ongoing.  SUBJECTIVE:                                                                                                                                                                                           SUBJECTIVE STATEMENT:   Pt reports 1-2/10 low back pain today.    PERTINENT HISTORY:  Neck pain, arthritis, RA, scoliosis since adolescents.  PAIN:  Are you having pain? Yes: NPRS scale: 1-2/10 Pain location: Low back. Pain  description: Ache. Aggravating factors: As above. Relieving factors: As above.  PRECAUTIONS: None  RED FLAGS: None   WEIGHT BEARING RESTRICTIONS: No  FALLS:  Has patient fallen in last 6 months? 1 had a misstep.  Hit back.  Small scab in left low back region.  LIVING ENVIRONMENT: Lives with: lives with their spouse Lives in: House/apartment Has following equipment at home: None  OCCUPATION: Retired.  PLOF: Independent  PATIENT GOALS: Decrease pain, do more, improve quality of life.    OBJECTIVE:  Note: Objective measures were completed at Evaluation  unless otherwise noted.   PATIENT SURVEYS:  FOTO 67.  POSTURE: rounded shoulders, forward head, and , scoliosis, right iliac crest higher.  PALPATION: Patient c/o pain across her lower lumbar region and pain "in" right hip.  LUMBAR ROM:  Active lumbar extension to 15 degrees. She exhibits full trunk but lumbar intervertebral motion is decreased by 50%.  LOWER EXTREMITY ROM:     WNL.  LOWER EXTREMITY MMT:    Normal bilateral LE strength.  LUMBAR SPECIAL TESTS:  Equal leg lengths. (-) SLR testing.  Positive right FABER test.   GAIT:  WNL.   TODAY'S TREATMENT:                                                                                                                              DATE:       12/20/22                                     EXERCISE LOG  Exercise Repetitions and Resistance Comments  Nustep Lvl 3 x 15 mins   NiSource 3 mins   Rockerboard 3 mins   Standing Hip Abduction 20 reps bil   Standing Hip Flexion 20 reps bil   Standing Ham Curls 20 reps bil    Blank cell = exercise not performed today   Modalities  Date:  Unattended Estim: Lumbar, IFC 80-150 Hz , 15 mins, Pain Hot Pack: Lumbar, 15 mins, Pain and Tone                                           12/16/22  [place] combo x 10 mins at 1.5 w/cm2 to LT Glute and QL Rt side lying Manual STW/ TPR to LT upper glute  and LT QL IFC x 15 mins 80-150hz   to upper glute/LB paras in RT side lying                                    EXERCISE LOG        12-14-22  Exercise Repetitions and Resistance Comments  AB bracing X10 hold 5 secs   Nustep L3 x 12 mins   Dying bug X6 hold 10 each side   Bridging X 10 hold 5 secs   Cat/camel X 10    Blank cell = exercise not performed today  Discussed walking program and movement patterns to decrease pain triggers during ADL's STW/TPR to LT QL in RT side lying    PATIENT EDUCATION:    HOME EXERCISE PROGRAM:   ASSESSMENT:  CLINICAL IMPRESSION:  Pt arrives for today's treatment session reporting 1-2/10 low back pain.  Pt introduced to  standing and physio-ball exercises today with min cues required for all newly added exercises. Pt reports mild increase in discomfort with standing marches, but pt able to perform all exercises asked of her.  Normal responses to estim and MH noted upon removal.  Pt reported 0/10 low back pain at completion of today's treatment session.    OBJECTIVE IMPAIRMENTS: decreased activity tolerance, increased muscle spasms, postural dysfunction, and pain.   ACTIVITY LIMITATIONS: carrying, lifting, sitting, standing, and locomotion level  PARTICIPATION LIMITATIONS: cleaning  PERSONAL FACTORS: Time since onset of injury/illness/exacerbation and 1 comorbidity: scoliosis  are also affecting patient's functional outcome.   REHAB POTENTIAL: Good  CLINICAL DECISION MAKING: Evolving/moderate complexity  EVALUATION COMPLEXITY: Low   GOALS:  LONG TERM GOALS: Target date: 01/19/23  Ind with a HEP.  Goal status: INITIAL  2.  Stand 30 minutes with pain not > 3-4/10.  Goal status: INITIAL  3.  Sit 30 minutes with pain not > 3-4/10.   Goal status: INITIAL  4.  Perform ADL's with pain not > 3-4/10.  Goal status: INITIAL PLAN:  PT FREQUENCY: 2x/week  PT DURATION: 6 weeks  PLANNED INTERVENTIONS: 97110-Therapeutic exercises, 97530-  Therapeutic activity, [id number]- Neuromuscular re-education, 97535- Self Care, 47425- Manual therapy, 97014- Electrical stimulation (unattended), 97035- Ultrasound, Patient/Family education, Dry Needling, Cryotherapy, and Moist heat.  PLAN FOR NEXT SESSION: Combo e'stim/US, STW/M, Core exercise progression, spinal protection techniques and body mechanics training.   Newman Pies, PTA 12/20/2022, 1:59 PM

## 2022-12-22 ENCOUNTER — Ambulatory Visit: Payer: Medicare HMO

## 2022-12-22 DIAGNOSIS — M62830 Muscle spasm of back: Secondary | ICD-10-CM | POA: Diagnosis not present

## 2022-12-22 DIAGNOSIS — R293 Abnormal posture: Secondary | ICD-10-CM | POA: Diagnosis not present

## 2022-12-22 DIAGNOSIS — M5459 Other low back pain: Secondary | ICD-10-CM

## 2022-12-22 NOTE — Therapy (Signed)
OUTPATIENT PHYSICAL THERAPY THORACOLUMBAR TREATMENT   Patient Name: Jasmin Hester MRN: [id number] DOB:01/22/1956, 67 y.o., female Today's Date: 12/22/2022  END OF SESSION:  PT End of Session - 12/22/22 1305     Visit Number 5    Number of Visits 12    Date for PT Re-Evaluation 01/19/23    Authorization Type FOTO.    PT Start Time 1300    PT Stop Time 1357    PT Time Calculation (min) 57 min    Activity Tolerance Patient tolerated treatment well    Behavior During Therapy WFL for tasks assessed/performed             Past Medical History:  Diagnosis Date   Cancer (HCC)    melanoma 2007   Past Surgical History:  Procedure Laterality Date   BREAST EXCISIONAL BIOPSY Left 1993    negative results   BREAST EXCISIONAL BIOPSY Right 3295,1884    neg   Patient Active Problem List   Diagnosis Date Noted   Arthritis 12/09/2014   Cardiac murmur 12/09/2014   Neutropenia (HCC) 12/09/2014   Female climacteric state 12/09/2014   Stress incontinence 12/09/2014   Postmenopausal HRT (hormone replacement therapy) 09/12/2014   Avitaminosis D 08/07/2008   Idiopathic scoliosis 07/23/2008   Clinical depression 07/11/2008   H/O Malignant melanoma 07/11/2008   Restless leg 07/11/2008    REFERRING PROVIDER: Bernerd Pho MD  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  Rationale for Evaluation and Treatment: Rehabilitation  THERAPY DIAG:  Abnormal posture  Other low back pain  Muscle spasm of back  ONSET DATE: Ongoing.  SUBJECTIVE:                                                                                                                                                                                           SUBJECTIVE STATEMENT:   Pt denies any pain today.  Pt reports that she went on a hike yesterday and it went very well.   PERTINENT HISTORY:  Neck pain, arthritis, RA, scoliosis since adolescents.  PAIN:  Are you having pain?  No  PRECAUTIONS: None  RED FLAGS: None   WEIGHT BEARING RESTRICTIONS: No  FALLS:  Has patient fallen in last 6 months? 1 had a misstep.  Hit back.  Small scab in left low back region.  LIVING ENVIRONMENT: Lives with: lives with their spouse Lives in: House/apartment Has following equipment at home: None  OCCUPATION: Retired.  PLOF: Independent  PATIENT GOALS: Decrease pain, do more, improve quality of life.    OBJECTIVE:  Note: Objective measures were completed at Evaluation unless otherwise noted.   PATIENT  SURVEYS:  FOTO 67.  POSTURE: rounded shoulders, forward head, and , scoliosis, right iliac crest higher.  PALPATION: Patient c/o pain across her lower lumbar region and pain "in" right hip.  LUMBAR ROM:  Active lumbar extension to 15 degrees. She exhibits full trunk but lumbar intervertebral motion is decreased by 50%.  LOWER EXTREMITY ROM:     WNL.  LOWER EXTREMITY MMT:    Normal bilateral LE strength.  LUMBAR SPECIAL TESTS:  Equal leg lengths. (-) SLR testing.  Positive right FABER test.   GAIT:  WNL.   TODAY'S TREATMENT:                                                                                                                              DATE:       12/22/22                                   EXERCISE LOG  Exercise Repetitions and Resistance Comments  Nustep Lvl 3 x 15 mins   Ball Press 3.5 mins   EMCOR 3.5 mins   Rockerboard 5 mins   Standing Hip Abduction 25 reps bil   Standing Hip Flexion 25 reps bil   Standing Ham Curls 25 reps bil    Blank cell = exercise not performed today   Modalities  Date:  Unattended Estim: Lumbar, IFC 80-150 Hz , 15 mins, Pain Hot Pack: Lumbar, 15 mins, Pain and Tone    12/16/22  [place] combo x 10 mins at 1.5 w/cm2 to LT Glute and QL Rt side lying Manual STW/ TPR to LT upper glute and LT QL IFC x 15 mins 80-150hz   to upper glute/LB paras in RT side lying                                     EXERCISE LOG        12-14-22  Exercise Repetitions and Resistance Comments  AB bracing X10 hold 5 secs   Nustep L3 x 12 mins   Dying bug X6 hold 10 each side   Bridging X 10 hold 5 secs   Cat/camel X 10    Blank cell = exercise not performed today  Discussed walking program and movement patterns to decrease pain triggers during ADL's STW/TPR to LT QL in RT side lying    PATIENT EDUCATION:    HOME EXERCISE PROGRAM:   ASSESSMENT:  CLINICAL IMPRESSION:  Pt arrives for today's treatment session denying any pain.  Pt reports going on a couple mile hike yesterday and felt very good afterward.  Pt able to tolerate increased reps or time with all exercises performed today.  Normal responses to estim and MH noted upon completion.  Pt denied any pain at completion of today's treatment session.  OBJECTIVE IMPAIRMENTS: decreased activity tolerance, increased muscle spasms, postural dysfunction, and pain.   ACTIVITY LIMITATIONS: carrying, lifting, sitting, standing, and locomotion level  PARTICIPATION LIMITATIONS: cleaning  PERSONAL FACTORS: Time since onset of injury/illness/exacerbation and 1 comorbidity: scoliosis  are also affecting patient's functional outcome.   REHAB POTENTIAL: Good  CLINICAL DECISION MAKING: Evolving/moderate complexity  EVALUATION COMPLEXITY: Low   GOALS:  LONG TERM GOALS: Target date: 01/19/23  Ind with a HEP.  Goal status: IN PROGRESS  2.  Stand 30 minutes with pain not > 3-4/10.  Goal status: IN PROGRESS  3.  Sit 30 minutes with pain not > 3-4/10.   Goal status: IN PROGRESS  4.  Perform ADL's with pain not > 3-4/10.  Goal status: IN PROGRESS PLAN:  PT FREQUENCY: 2x/week  PT DURATION: 6 weeks  PLANNED INTERVENTIONS: 97110-Therapeutic exercises, 97530- Therapeutic activity, [id number]- Neuromuscular re-education, 97535- Self Care, 40102- Manual therapy, 97014- Electrical stimulation (unattended), 97035- Ultrasound, Patient/Family education, Dry  Needling, Cryotherapy, and Moist heat.  PLAN FOR NEXT SESSION: Combo e'stim/US, STW/M, Core exercise progression, spinal protection techniques and body mechanics training.   Newman Pies, PTA 12/22/2022, 2:08 PM

## 2022-12-27 ENCOUNTER — Ambulatory Visit: Payer: Medicare HMO | Attending: Student in an Organized Health Care Education/Training Program | Admitting: *Deleted

## 2022-12-27 ENCOUNTER — Encounter: Payer: Self-pay | Admitting: *Deleted

## 2022-12-27 DIAGNOSIS — M62830 Muscle spasm of back: Secondary | ICD-10-CM | POA: Diagnosis not present

## 2022-12-27 DIAGNOSIS — R293 Abnormal posture: Secondary | ICD-10-CM | POA: Insufficient documentation

## 2022-12-27 DIAGNOSIS — M5459 Other low back pain: Secondary | ICD-10-CM | POA: Insufficient documentation

## 2022-12-27 NOTE — Therapy (Signed)
OUTPATIENT PHYSICAL THERAPY THORACOLUMBAR TREATMENT   Patient Name: Jasmin Hester MRN: [id number] DOB:1955-11-21, 67 y.o., female Today's Date: 12/27/2022  END OF SESSION:  PT End of Session - 12/27/22 1113     Visit Number 6    Number of Visits 12    Authorization Type FOTO.    PT Start Time 1100    PT Stop Time 1200    PT Time Calculation (min) 60 min             Past Medical History:  Diagnosis Date   Cancer (HCC)    melanoma 2007   Past Surgical History:  Procedure Laterality Date   BREAST EXCISIONAL BIOPSY Left 1993    negative results   BREAST EXCISIONAL BIOPSY Right 8657,8469    neg   Patient Active Problem List   Diagnosis Date Noted   Arthritis 12/09/2014   Cardiac murmur 12/09/2014   Neutropenia (HCC) 12/09/2014   Female climacteric state 12/09/2014   Stress incontinence 12/09/2014   Postmenopausal HRT (hormone replacement therapy) 09/12/2014   Avitaminosis D 08/07/2008   Idiopathic scoliosis 07/23/2008   Clinical depression 07/11/2008   H/O Malignant melanoma 07/11/2008   Restless leg 07/11/2008    REFERRING PROVIDER: Bernerd Pho MD  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  Rationale for Evaluation and Treatment: Rehabilitation  THERAPY DIAG:  Abnormal posture  Other low back pain  Muscle spasm of back  ONSET DATE: Ongoing.  SUBJECTIVE:                                                                                                                                                                                           SUBJECTIVE STATEMENT:   2/10 LB and LT hip  Pt reports 10% better since starting  PERTINENT HISTORY:  Neck pain, arthritis, RA, scoliosis since adolescents.  PAIN:  Are you having pain? No  PRECAUTIONS: None  RED FLAGS: None   WEIGHT BEARING RESTRICTIONS: No  FALLS:  Has patient fallen in last 6 months? 1 had a misstep.  Hit back.  Small scab in left low back region.  LIVING  ENVIRONMENT: Lives with: lives with their spouse Lives in: House/apartment Has following equipment at home: None  OCCUPATION: Retired.  PLOF: Independent  PATIENT GOALS: Decrease pain, do more, improve quality of life.    OBJECTIVE:  Note: Objective measures were completed at Evaluation unless otherwise noted.   PATIENT SURVEYS:  FOTO 67.  POSTURE: rounded shoulders, forward head, and , scoliosis, right iliac crest higher.  PALPATION: Patient c/o pain across her lower lumbar region and pain "in" right hip.  LUMBAR ROM:  Active lumbar extension to 15 degrees. She exhibits full trunk but lumbar intervertebral motion is decreased by 50%.  LOWER EXTREMITY ROM:     WNL.  LOWER EXTREMITY MMT:    Normal bilateral LE strength.  LUMBAR SPECIAL TESTS:  Equal leg lengths. (-) SLR testing.  Positive right FABER test.   GAIT:  WNL.   TODAY'S TREATMENT:                                                                                                                              DATE:       12/27/22                                   EXERCISE LOG  LB/ LT hip  Exercise Repetitions and Resistance Comments  Nustep Lvl 3 x 12 mins   NiSource    Rockerboard    Standing Hip Abduction    Standing Hip Flexion    Standing Ham Curls    Dying bug X6 hold 10 secs each side   Bridge X10 hold 5 secs   Cat/camel  x10   Standing QL stretch X 3 hold 30-60 secs    Blank cell = exercise not performed today  Manual STW/TPR to LT QL, SIJ,  and glut with Pt RT side lying Modalities  Date:  Unattended Estim: Lumbar, IFC 80-150 Hz , 15 mins, Pain Hot Pack: Lumbar, 15 mins, Pain and Tone      12/16/22  [place] combo x 10 mins at 1.5 w/cm2 to LT Glute and QL Rt side lying Manual STW/ TPR to LT upper glute and LT QL IFC x 15 mins 80-150hz   to upper glute/LB paras in RT side lying                                    EXERCISE LOG        12-14-22  Exercise Repetitions and  Resistance Comments  AB bracing X10 hold 5 secs   Nustep L3 x 12 mins   Dying bug X6 hold 10 each side   Bridging X 10 hold 5 secs   Cat/camel X 10    Blank cell = exercise not performed today  Discussed walking program and movement patterns to decrease pain triggers during ADL's STW/TPR to LT QL in RT side lying    PATIENT EDUCATION:    HOME EXERCISE PROGRAM:   ASSESSMENT:  CLINICAL IMPRESSION:  Pt arrives for today's treatment session with LT LB/ Hip tightness/soreness 2/10 any pain. Rx focused on therex as well as manual STW/ TPR to LT glut, SIJ and QL. She had notable tenderness still LT glut and SIJ, but with decreased symptoms after session.    OBJECTIVE IMPAIRMENTS: decreased activity tolerance, increased muscle  spasms, postural dysfunction, and pain.   ACTIVITY LIMITATIONS: carrying, lifting, sitting, standing, and locomotion level  PARTICIPATION LIMITATIONS: cleaning  PERSONAL FACTORS: Time since onset of injury/illness/exacerbation and 1 comorbidity: scoliosis  are also affecting patient's functional outcome.   REHAB POTENTIAL: Good  CLINICAL DECISION MAKING: Evolving/moderate complexity  EVALUATION COMPLEXITY: Low   GOALS:  LONG TERM GOALS: Target date: 01/19/23  Ind with a HEP.  Goal status: IN PROGRESS  2.  Stand 30 minutes with pain not > 3-4/10.  Goal status: IN PROGRESS  3.  Sit 30 minutes with pain not > 3-4/10.   Goal status: IN PROGRESS  4.  Perform ADL's with pain not > 3-4/10.  Goal status: IN PROGRESS PLAN:  PT FREQUENCY: 2x/week  PT DURATION: 6 weeks  PLANNED INTERVENTIONS: 97110-Therapeutic exercises, 97530- Therapeutic activity, [id number]- Neuromuscular re-education, 97535- Self Care, 40981- Manual therapy, 97014- Electrical stimulation (unattended), 97035- Ultrasound, Patient/Family education, Dry Needling, Cryotherapy, and Moist heat.  PLAN FOR NEXT SESSION: Combo e'stim/US, STW/M, Core exercise progression, spinal protection  techniques and body mechanics training.   Jalila Goodnough,CHRIS, PTA 12/27/2022, 12:06 PM

## 2022-12-29 ENCOUNTER — Ambulatory Visit: Payer: Medicare HMO | Admitting: Physical Therapy

## 2022-12-29 DIAGNOSIS — M62830 Muscle spasm of back: Secondary | ICD-10-CM | POA: Diagnosis not present

## 2022-12-29 DIAGNOSIS — M5459 Other low back pain: Secondary | ICD-10-CM

## 2022-12-29 DIAGNOSIS — R293 Abnormal posture: Secondary | ICD-10-CM | POA: Diagnosis not present

## 2022-12-29 NOTE — Therapy (Signed)
OUTPATIENT PHYSICAL THERAPY THORACOLUMBAR TREATMENT   Patient Name: Jasmin Hester MRN: [id number] DOB:Nov 14, 1955, 67 y.o., female Today's Date: 12/29/2022  END OF SESSION:  PT End of Session - 12/29/22 1137     Visit Number 7    Number of Visits 12    Date for PT Re-Evaluation 01/19/23    Authorization Type FOTO.    PT Start Time 1100    PT Stop Time 1153    PT Time Calculation (min) 53 min    Activity Tolerance Patient tolerated treatment well    Behavior During Therapy WFL for tasks assessed/performed             Past Medical History:  Diagnosis Date   Cancer (HCC)    melanoma 2007   Past Surgical History:  Procedure Laterality Date   BREAST EXCISIONAL BIOPSY Left 1993    negative results   BREAST EXCISIONAL BIOPSY Right 3244,0102    neg   Patient Active Problem List   Diagnosis Date Noted   Arthritis 12/09/2014   Cardiac murmur 12/09/2014   Neutropenia (HCC) 12/09/2014   Female climacteric state 12/09/2014   Stress incontinence 12/09/2014   Postmenopausal HRT (hormone replacement therapy) 09/12/2014   Avitaminosis D 08/07/2008   Idiopathic scoliosis 07/23/2008   Clinical depression 07/11/2008   H/O Malignant melanoma 07/11/2008   Restless leg 07/11/2008    REFERRING PROVIDER: Bernerd Pho MD  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  Rationale for Evaluation and Treatment: Rehabilitation  THERAPY DIAG:  Abnormal posture  Other low back pain  Muscle spasm of back  ONSET DATE: Ongoing.  SUBJECTIVE:                                                                                                                                                                                           SUBJECTIVE STATEMENT:   Last treatment helped and low pain today.  PERTINENT HISTORY:  Neck pain, arthritis, RA, scoliosis since adolescents.  PAIN:  Are you having pain? 1-2/10.  PRECAUTIONS: None  RED FLAGS: None   WEIGHT BEARING  RESTRICTIONS: No  FALLS:  Has patient fallen in last 6 months? 1 had a misstep.  Hit back.  Small scab in left low back region.  LIVING ENVIRONMENT: Lives with: lives with their spouse Lives in: House/apartment Has following equipment at home: None  OCCUPATION: Retired.  PLOF: Independent  PATIENT GOALS: Decrease pain, do more, improve quality of life.    OBJECTIVE:  Note: Objective measures were completed at Evaluation unless otherwise noted.   PATIENT SURVEYS:  FOTO 67.  POSTURE: rounded shoulders, forward head, and , scoliosis, right  iliac crest higher.  PALPATION: Patient c/o pain across her lower lumbar region and pain "in" right hip.  LUMBAR ROM:  Active lumbar extension to 15 degrees. She exhibits full trunk but lumbar intervertebral motion is decreased by 50%.  LOWER EXTREMITY ROM:     WNL.  LOWER EXTREMITY MMT:    Normal bilateral LE strength.  LUMBAR SPECIAL TESTS:  Equal leg lengths. (-) SLR testing.  Positive right FABER test.   GAIT:  WNL.   TODAY'S TREATMENT:                                                                                                                              DATE:        12/29/22:  Patient in right SDLY position with folded pillow between knees for comfort:  Combo e'stim/US at 1.50 W/CM2 x 12 minutes to left SIJ/proximal glut region f/b STW/M x 11 minutes f/b HMP and IFC at 80-150 Hz on 40% scan x 20 minutes.  Normal modality response following removal of modality.   12/27/22                                   EXERCISE LOG  LB/ LT hip  Exercise Repetitions and Resistance Comments  Nustep Lvl 3 x 12 mins   Ni[employer]    Rockerboard    Standing Hip Abduction    Standing Hip Flexion    Standing Ham Curls    Dying bug X6 hold 10 secs each side   Bridge X10 hold 5 secs   Cat/camel  x10   Standing QL stretch X 3 hold 30-60 secs    Blank cell = exercise not performed today  Manual STW/TPR to LT QL,  SIJ,  and glut with Pt RT side lying Modalities  Date:  Unattended Estim: Lumbar, IFC 80-150 Hz , 15 mins, Pain Hot Pack: Lumbar, 15 mins, Pain and Tone      12/16/22  [place] combo x 10 mins at 1.5 w/cm2 to LT Glute and QL Rt side lying Manual STW/ TPR to LT upper glute and LT QL IFC x 15 mins 80-150hz   to upper glute/LB paras in RT side lying                                    EXERCISE LOG        12-14-22  Exercise Repetitions and Resistance Comments  AB bracing X10 hold 5 secs   Nustep L3 x 12 mins   Dying bug X6 hold 10 each side   Bridging X 10 hold 5 secs   Cat/camel X 10    Blank cell = exercise not performed today  Discussed walking program and movement patterns to decrease pain triggers during ADL's STW/TPR to  LT QL in RT side lying    PATIENT EDUCATION:    HOME EXERCISE PROGRAM:   ASSESSMENT:  CLINICAL IMPRESSION:  The patient is very motivated.  Last treatment was helpful.  She had some tenderness over her left proximal glut and piriformis region.  She did well with treatment today, tolerating without complaint.   OBJECTIVE IMPAIRMENTS: decreased activity tolerance, increased muscle spasms, postural dysfunction, and pain.   ACTIVITY LIMITATIONS: carrying, lifting, sitting, standing, and locomotion level  PARTICIPATION LIMITATIONS: cleaning  PERSONAL FACTORS: Time since onset of injury/illness/exacerbation and 1 comorbidity: scoliosis  are also affecting patient's functional outcome.   REHAB POTENTIAL: Good  CLINICAL DECISION MAKING: Evolving/moderate complexity  EVALUATION COMPLEXITY: Low   GOALS:  LONG TERM GOALS: Target date: 01/19/23  Ind with a HEP.  Goal status: IN PROGRESS  2.  Stand 30 minutes with pain not > 3-4/10.  Goal status: IN PROGRESS  3.  Sit 30 minutes with pain not > 3-4/10.   Goal status: IN PROGRESS  4.  Perform ADL's with pain not > 3-4/10.  Goal status: IN PROGRESS PLAN:  PT FREQUENCY: 2x/week  PT DURATION: 6  weeks  PLANNED INTERVENTIONS: 97110-Therapeutic exercises, 97530- Therapeutic activity, [id number]- Neuromuscular re-education, 97535- Self Care, 78295- Manual therapy, 97014- Electrical stimulation (unattended), 97035- Ultrasound, Patient/Family education, Dry Needling, Cryotherapy, and Moist heat.  PLAN FOR NEXT SESSION: Combo e'stim/US, STW/M, Core exercise progression, spinal protection techniques and body mechanics training.   Davetta Olliff, [place], PT 12/29/2022, 11:56 AM

## 2023-01-05 ENCOUNTER — Ambulatory Visit: Payer: Medicare HMO

## 2023-01-05 DIAGNOSIS — M62830 Muscle spasm of back: Secondary | ICD-10-CM | POA: Diagnosis not present

## 2023-01-05 DIAGNOSIS — M5459 Other low back pain: Secondary | ICD-10-CM

## 2023-01-05 DIAGNOSIS — R293 Abnormal posture: Secondary | ICD-10-CM

## 2023-01-05 NOTE — Therapy (Signed)
OUTPATIENT PHYSICAL THERAPY THORACOLUMBAR TREATMENT   Patient Name: Jasmin Hester MRN: [id number] DOB:Dec 02, 1955, 67 y.o., female Today's Date: 01/05/2023  END OF SESSION:  PT End of Session - 01/05/23 1104     Visit Number 8    Number of Visits 12    Date for PT Re-Evaluation 01/19/23    Authorization Type FOTO.    PT Start Time 1100    Activity Tolerance Patient tolerated treatment well    Behavior During Therapy [facility] for tasks assessed/performed             Past Medical History:  Diagnosis Date   Cancer (HCC)    melanoma 2007   Past Surgical History:  Procedure Laterality Date   BREAST EXCISIONAL BIOPSY Left 1993    negative results   BREAST EXCISIONAL BIOPSY Right 9562,1308    neg   Patient Active Problem List   Diagnosis Date Noted   Arthritis 12/09/2014   Cardiac murmur 12/09/2014   Neutropenia (HCC) 12/09/2014   Female climacteric state 12/09/2014   Stress incontinence 12/09/2014   Postmenopausal HRT (hormone replacement therapy) 09/12/2014   Avitaminosis D 08/07/2008   Idiopathic scoliosis 07/23/2008   Clinical depression 07/11/2008   H/O Malignant melanoma 07/11/2008   Restless leg 07/11/2008    REFERRING PROVIDER: Bernerd Pho MD  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  Rationale for Evaluation and Treatment: Rehabilitation  THERAPY DIAG:  Abnormal posture  Other low back pain  Muscle spasm of back  ONSET DATE: Ongoing.  SUBJECTIVE:                                                                                                                                                                                           SUBJECTIVE STATEMENT:   Pt reports very minimal pain today.   PERTINENT HISTORY:  Neck pain, arthritis, RA, scoliosis since adolescents.  PAIN:  Are you having pain? 1/10.  PRECAUTIONS: None  RED FLAGS: None   WEIGHT BEARING RESTRICTIONS: No  FALLS:  Has patient fallen in last 6 months?  1 had a misstep.  Hit back.  Small scab in left low back region.  LIVING ENVIRONMENT: Lives with: lives with their spouse Lives in: House/apartment Has following equipment at home: None  OCCUPATION: Retired.  PLOF: Independent  PATIENT GOALS: Decrease pain, do more, improve quality of life.    OBJECTIVE:  Note: Objective measures were completed at Evaluation unless otherwise noted.   PATIENT SURVEYS:  FOTO 67.  POSTURE: rounded shoulders, forward head, and , scoliosis, right iliac crest higher.  PALPATION: Patient c/o pain across her lower lumbar region and pain "in"  right hip.  LUMBAR ROM:  Active lumbar extension to 15 degrees. She exhibits full trunk but lumbar intervertebral motion is decreased by 50%.  LOWER EXTREMITY ROM:     WNL.  LOWER EXTREMITY MMT:    Normal bilateral LE strength.  LUMBAR SPECIAL TESTS:  Equal leg lengths. (-) SLR testing.  Positive right FABER test.   GAIT:  WNL.   TODAY'S TREATMENT:                                                                                                                              DATE:          01/05/23 EXERCISE LOG  LB/ LT hip  Exercise Repetitions and Resistance Comments  Nustep Lvl 3 x 15 mins   Rockerboard 5 mins   Standing Hip Abduction 2# x 20 reps bil   Standing Hip Flexion 2# x 20 reps bil   Standing Ham Curls 2# x 20 reps bil   Side-stepping Airex x 3 mins   Tandem Gait Airex x 3 mins    Blank cell = exercise not performed today   Modalities  Date:  Unattended Estim: Lumbar, IFC 80-150 Hz , 15 mins, Pain Hot Pack: Lumbar, 15 mins, Pain and Tone   12/29/22:  Patient in right SDLY position with folded pillow between knees for comfort:  Combo e'stim/US at 1.50 W/CM2 x 12 minutes to left SIJ/proximal glut region f/b STW/M x 11 minutes f/b HMP and IFC at 80-150 Hz on 40% scan x 20 minutes.  Normal modality response following removal of modality.   12/27/22                                    EXERCISE LOG  LB/ LT hip  Exercise Repetitions and Resistance Comments  Nustep Lvl 3 x 12 mins   [employer]    Rockerboard    Standing Hip Abduction    Standing Hip Flexion    Standing Ham Curls    Dying bug X6 hold 10 secs each side   Bridge X10 hold 5 secs   Cat/camel  x10   Standing QL stretch X 3 hold 30-60 secs    Blank cell = exercise not performed today  Manual STW/TPR to LT QL, SIJ,  and glut with Pt RT side lying Modalities  Date:  Unattended Estim: Lumbar, IFC 80-150 Hz , 15 mins, Pain Hot Pack: Lumbar, 15 mins, Pain and Tone    PATIENT EDUCATION:    HOME EXERCISE PROGRAM:   ASSESSMENT:  CLINICAL IMPRESSION:  Pt arrives for today's treatment session reporting 1/10 low back pain.  Pt reports that back is feeling much better since beginning therapy.  Pt able to tolerate introduction of two pound weights with standing exercises today.  Pt denied any increase in pain, but did endorse increased fatigue.  Pt also introduced to standing Airex balance beam exercises with intermittent BUE support required.  Normal responses to estim and MH noted upon removal.  Pt denied any pain at completion of today's treatment session.   OBJECTIVE IMPAIRMENTS: decreased activity tolerance, increased muscle spasms, postural dysfunction, and pain.   ACTIVITY LIMITATIONS: carrying, lifting, sitting, standing, and locomotion level  PARTICIPATION LIMITATIONS: cleaning  PERSONAL FACTORS: Time since onset of injury/illness/exacerbation and 1 comorbidity: scoliosis  are also affecting patient's functional outcome.   REHAB POTENTIAL: Good  CLINICAL DECISION MAKING: Evolving/moderate complexity  EVALUATION COMPLEXITY: Low   GOALS:  LONG TERM GOALS: Target date: 01/19/23  Ind with a HEP.  Goal status: IN PROGRESS  2.  Stand 30 minutes with pain not > 3-4/10.  Goal status: IN PROGRESS  3.  Sit 30 minutes with pain not > 3-4/10.   Goal status: IN PROGRESS  4.   Perform ADL's with pain not > 3-4/10.  Goal status: IN PROGRESS PLAN:  PT FREQUENCY: 2x/week  PT DURATION: 6 weeks  PLANNED INTERVENTIONS: 97110-Therapeutic exercises, 97530- Therapeutic activity, [id number]- Neuromuscular re-education, 97535- Self Care, 16109- Manual therapy, 97014- Electrical stimulation (unattended), 97035- Ultrasound, Patient/Family education, Dry Needling, Cryotherapy, and Moist heat.  PLAN FOR NEXT SESSION: Combo e'stim/US, STW/M, Core exercise progression, spinal protection techniques and body mechanics training.   Newman Pies, PTA 01/05/2023, 11:56 AM

## 2023-01-06 ENCOUNTER — Other Ambulatory Visit: Payer: Self-pay | Admitting: Medical Genetics

## 2023-01-06 DIAGNOSIS — Z006 Encounter for examination for normal comparison and control in clinical research program: Secondary | ICD-10-CM

## 2023-01-07 ENCOUNTER — Ambulatory Visit: Payer: Medicare HMO

## 2023-01-07 DIAGNOSIS — M5459 Other low back pain: Secondary | ICD-10-CM

## 2023-01-07 DIAGNOSIS — M62830 Muscle spasm of back: Secondary | ICD-10-CM | POA: Diagnosis not present

## 2023-01-07 DIAGNOSIS — R293 Abnormal posture: Secondary | ICD-10-CM | POA: Diagnosis not present

## 2023-01-07 NOTE — Therapy (Signed)
OUTPATIENT PHYSICAL THERAPY THORACOLUMBAR TREATMENT   Patient Name: Jasmin Hester MRN: [id number] DOB:[birth year], 67 y.o., female Today's Date: 01/07/2023  END OF SESSION:  PT End of Session - 01/07/23 1102     Visit Number 9    Number of Visits 12    Date for PT Re-Evaluation 01/19/23    Authorization Type FOTO.    PT Start Time 1100    PT Stop Time 1158    PT Time Calculation (min) 58 min    Activity Tolerance Patient tolerated treatment well    Behavior During Therapy WFL for tasks assessed/performed             Past Medical History:  Diagnosis Date   Cancer (HCC)    melanoma 2007   Past Surgical History:  Procedure Laterality Date   BREAST EXCISIONAL BIOPSY Left 1993    negative results   BREAST EXCISIONAL BIOPSY Right 9629,5284    neg   Patient Active Problem List   Diagnosis Date Noted   Arthritis 12/09/2014   Cardiac murmur 12/09/2014   Neutropenia (HCC) 12/09/2014   Female climacteric state 12/09/2014   Stress incontinence 12/09/2014   Postmenopausal HRT (hormone replacement therapy) 09/12/2014   Avitaminosis D 08/07/2008   Idiopathic scoliosis 07/23/2008   Clinical depression 07/11/2008   H/O Malignant melanoma 07/11/2008   Restless leg 07/11/2008    REFERRING PROVIDER: Bernerd Pho MD  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  Rationale for Evaluation and Treatment: Rehabilitation  THERAPY DIAG:  Abnormal posture  Muscle spasm of back  Other low back pain  ONSET DATE: Ongoing.  SUBJECTIVE:                                                                                                                                                                                           SUBJECTIVE STATEMENT:   Pt reports very minimal pain today.   PERTINENT HISTORY:  Neck pain, arthritis, RA, scoliosis since adolescents.  PAIN:  Are you having pain? 1/10.  PRECAUTIONS: None  RED FLAGS: None   WEIGHT BEARING  RESTRICTIONS: No  FALLS:  Has patient fallen in last 6 months? 1 had a misstep.  Hit back.  Small scab in left low back region.  LIVING ENVIRONMENT: Lives with: lives with their spouse Lives in: House/apartment Has following equipment at home: None  OCCUPATION: Retired.  PLOF: Independent  PATIENT GOALS: Decrease pain, do more, improve quality of life.    OBJECTIVE:  Note: Objective measures were completed at Evaluation unless otherwise noted.   PATIENT SURVEYS:  FOTO 67.  POSTURE: rounded shoulders, forward head, and , scoliosis, right  iliac crest higher.  PALPATION: Patient c/o pain across her lower lumbar region and pain "in" right hip.  LUMBAR ROM:  Active lumbar extension to 15 degrees. She exhibits full trunk but lumbar intervertebral motion is decreased by 50%.  LOWER EXTREMITY ROM:     WNL.  LOWER EXTREMITY MMT:    Normal bilateral LE strength.  LUMBAR SPECIAL TESTS:  Equal leg lengths. (-) SLR testing.  Positive right FABER test.   GAIT:  WNL.   TODAY'S TREATMENT:                                                                                                                              DATE:          01/07/23 EXERCISE LOG  LB/ LT hip  Exercise Repetitions and Resistance Comments  Nustep Lvl 4 x 15 mins   Rockerboard 6 mins   Standing Hip Abduction 2# x 25 reps bil   Standing Hip Flexion 2# x 25 reps bil   Standing Ham Curls 2# x 25 reps bil   Side-stepping Airex x 4 mins   Tandem Gait Airex x 4 mins    Blank cell = exercise not performed today   Modalities  Date:  Unattended Estim: Lumbar, IFC 80-150 Hz , 15 mins, Pain Hot Pack: Lumbar, 15 mins, Pain and Tone   12/29/22:  Patient in right SDLY position with folded pillow between knees for comfort:  Combo e'stim/US at 1.50 W/CM2 x 12 minutes to left SIJ/proximal glut region f/b STW/M x 11 minutes f/b HMP and IFC at 80-150 Hz on 40% scan x 20 minutes.  Normal modality response following  removal of modality.   12/27/22                                   EXERCISE LOG  LB/ LT hip  Exercise Repetitions and Resistance Comments  Nustep Lvl 3 x 12 mins   [employer]    Rockerboard    Standing Hip Abduction    Standing Hip Flexion    Standing Ham Curls    Dying bug X6 hold 10 secs each side   Bridge X10 hold 5 secs   Cat/camel  x10   Standing QL stretch X 3 hold 30-60 secs    Blank cell = exercise not performed today  Manual STW/TPR to LT QL, SIJ,  and glut with Pt RT side lying Modalities  Date:  Unattended Estim: Lumbar, IFC 80-150 Hz , 15 mins, Pain Hot Pack: Lumbar, 15 mins, Pain and Tone    PATIENT EDUCATION:    HOME EXERCISE PROGRAM:   ASSESSMENT:  CLINICAL IMPRESSION:  Pt arrives for today's treatment session reporting 0/10 low back pain.  Pt able to increase FOTO score to 83 today.  Pt able to tolerate increased time or reps with all exercises  performed today.  Pt to possibly discharge at next treatment session.  Normal responses to estim and MH noted upon removal.  Pt denied any pain at completion of today's treatment session.  OBJECTIVE IMPAIRMENTS: decreased activity tolerance, increased muscle spasms, postural dysfunction, and pain.   ACTIVITY LIMITATIONS: carrying, lifting, sitting, standing, and locomotion level  PARTICIPATION LIMITATIONS: cleaning  PERSONAL FACTORS: Time since onset of injury/illness/exacerbation and 1 comorbidity: scoliosis  are also affecting patient's functional outcome.   REHAB POTENTIAL: Good  CLINICAL DECISION MAKING: Evolving/moderate complexity  EVALUATION COMPLEXITY: Low   GOALS:  LONG TERM GOALS: Target date: 01/19/23  Ind with a HEP.  Goal status: IN PROGRESS  2.  Stand 30 minutes with pain not > 3-4/10.  Goal status: IN PROGRESS  3.  Sit 30 minutes with pain not > 3-4/10.   Goal status: IN PROGRESS  4.  Perform ADL's with pain not > 3-4/10.  Goal status: IN PROGRESS PLAN:  PT  FREQUENCY: 2x/week  PT DURATION: 6 weeks  PLANNED INTERVENTIONS: 97110-Therapeutic exercises, 97530- Therapeutic activity, [id number]- Neuromuscular re-education, 97535- Self Care, 10272- Manual therapy, 97014- Electrical stimulation (unattended), 97035- Ultrasound, Patient/Family education, Dry Needling, Cryotherapy, and Moist heat.  PLAN FOR NEXT SESSION: Combo e'stim/US, STW/M, Core exercise progression, spinal protection techniques and body mechanics training.   Newman Pies, PTA 01/07/2023, 12:00 PM

## 2023-01-11 ENCOUNTER — Other Ambulatory Visit (HOSPITAL_COMMUNITY)
Admission: RE | Admit: 2023-01-11 | Discharge: 2023-01-11 | Disposition: A | Discharge status: Home / Self Care | Payer: Medicare HMO | Source: Ambulatory Visit | Attending: Medical Genetics | Admitting: Medical Genetics

## 2023-01-11 DIAGNOSIS — Z006 Encounter for examination for normal comparison and control in clinical research program: Secondary | ICD-10-CM

## 2023-01-13 ENCOUNTER — Ambulatory Visit: Payer: Medicare HMO

## 2023-01-13 DIAGNOSIS — R293 Abnormal posture: Secondary | ICD-10-CM

## 2023-01-13 DIAGNOSIS — M5459 Other low back pain: Secondary | ICD-10-CM

## 2023-01-13 DIAGNOSIS — M62830 Muscle spasm of back: Secondary | ICD-10-CM | POA: Diagnosis not present

## 2023-01-13 NOTE — Therapy (Addendum)
OUTPATIENT PHYSICAL THERAPY THORACOLUMBAR TREATMENT   Patient Name: Jasmin Hester MRN: [id number] DOB:[birth year], 67 y.o., female Today's Date: 01/13/2023  END OF SESSION:  PT End of Session - 01/13/23 1020     Visit Number 10    Number of Visits 12    Date for PT Re-Evaluation 01/19/23    Authorization Type FOTO.    PT Start Time 1015    PT Stop Time 1100    PT Time Calculation (min) 45 min    Activity Tolerance Patient tolerated treatment well    Behavior During Therapy WFL for tasks assessed/performed             Past Medical History:  Diagnosis Date   Cancer (HCC)    melanoma 2007   Past Surgical History:  Procedure Laterality Date   BREAST EXCISIONAL BIOPSY Left 1993    negative results   BREAST EXCISIONAL BIOPSY Right 8295,6213    neg   Patient Active Problem List   Diagnosis Date Noted   Arthritis 12/09/2014   Cardiac murmur 12/09/2014   Neutropenia (HCC) 12/09/2014   Female climacteric state 12/09/2014   Stress incontinence 12/09/2014   Postmenopausal HRT (hormone replacement therapy) 09/12/2014   Avitaminosis D 08/07/2008   Idiopathic scoliosis 07/23/2008   Clinical depression 07/11/2008   H/O Malignant melanoma 07/11/2008   Restless leg 07/11/2008    REFERRING PROVIDER: Bernerd Pho MD  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  Rationale for Evaluation and Treatment: Rehabilitation  THERAPY DIAG:  Abnormal posture  Muscle spasm of back  Other low back pain  ONSET DATE: Ongoing.  SUBJECTIVE:                                                                                                                                                                                           SUBJECTIVE STATEMENT:   Pt reports very minimal pain today.   PERTINENT HISTORY:  Neck pain, arthritis, RA, scoliosis since adolescents.  PAIN:  Are you having pain? 1/10.  PRECAUTIONS: None  RED FLAGS: None   WEIGHT BEARING  RESTRICTIONS: No  FALLS:  Has patient fallen in last 6 months? 1 had a misstep.  Hit back.  Small scab in left low back region.  LIVING ENVIRONMENT: Lives with: lives with their spouse Lives in: House/apartment Has following equipment at home: None  OCCUPATION: Retired.  PLOF: Independent  PATIENT GOALS: Decrease pain, do more, improve quality of life.    OBJECTIVE:  Note: Objective measures were completed at Evaluation unless otherwise noted.   PATIENT SURVEYS:  FOTO 67.  POSTURE: rounded shoulders, forward head, and , scoliosis, right  iliac crest higher.  PALPATION: Patient c/o pain across her lower lumbar region and pain "in" right hip.  LUMBAR ROM:  Active lumbar extension to 15 degrees. She exhibits full trunk but lumbar intervertebral motion is decreased by 50%.  LOWER EXTREMITY ROM:     WNL.  LOWER EXTREMITY MMT:    Normal bilateral LE strength.  LUMBAR SPECIAL TESTS:  Equal leg lengths. (-) SLR testing.  Positive right FABER test.   GAIT:  WNL.   TODAY'S TREATMENT:                                                                                                                              DATE:          01/13/23 EXERCISE LOG  LB/ LT hip  Exercise Repetitions and Resistance Comments  Nustep Lvl 4 x 15 mins   Rockerboard 6 mins   Standing Hip Abduction 2# x 25 reps bil   Standing Hip Flexion 2# x 25 reps bil   Standing Ham Curls 2# x 25 reps bil   Side-stepping Airex x 4 mins   Tandem Gait Airex x 4 mins   STS 15 reps    Blank cell = exercise not performed today   Modalities  Date:  Unattended Estim: Lumbar, IFC 80-150 Hz , 15 mins, Pain Hot Pack: Lumbar, 15 mins, Pain and Tone   12/29/22:  Patient in right SDLY position with folded pillow between knees for comfort:  Combo e'stim/US at 1.50 W/CM2 x 12 minutes to left SIJ/proximal glut region f/b STW/M x 11 minutes f/b HMP and IFC at 80-150 Hz on 40% scan x 20 minutes.  Normal modality  response following removal of modality.   12/27/22                                   EXERCISE LOG  LB/ LT hip  Exercise Repetitions and Resistance Comments  Nustep Lvl 3 x 12 mins   [employer]    Rockerboard    Standing Hip Abduction    Standing Hip Flexion    Standing Ham Curls    Dying bug X6 hold 10 secs each side   Bridge X10 hold 5 secs   Cat/camel  x10   Standing QL stretch X 3 hold 30-60 secs    Blank cell = exercise not performed today  Manual STW/TPR to LT QL, SIJ,  and glut with Pt RT side lying Modalities  Date:  Unattended Estim: Lumbar, IFC 80-150 Hz , 15 mins, Pain Hot Pack: Lumbar, 15 mins, Pain and Tone    PATIENT EDUCATION:    HOME EXERCISE PROGRAM: [email or web address]  Access Code: [email or web address]  ASSESSMENT:  CLINICAL IMPRESSION:  Pt arrives for today's treatment session denying any pain.  Pt reports feeling much better since beginning therapy and it has been weeks since  she has experienced.  Pt given printed HEP with red and green theraband for home use.  Pt able to demonstrate understanding and proper technique with all exercises given to her.  Pt has met all the goals set forth for her at this time.  Pt encouraged to call the facility with any questions or concerns.  Pt denied any pain at completion of today's treatment session.  Pt ready for discharge at this time.  OBJECTIVE IMPAIRMENTS: decreased activity tolerance, increased muscle spasms, postural dysfunction, and pain.   ACTIVITY LIMITATIONS: carrying, lifting, sitting, standing, and locomotion level  PARTICIPATION LIMITATIONS: cleaning  PERSONAL FACTORS: Time since onset of injury/illness/exacerbation and 1 comorbidity: scoliosis  are also affecting patient's functional outcome.   REHAB POTENTIAL: Good  CLINICAL DECISION MAKING: Evolving/moderate complexity  EVALUATION COMPLEXITY: Low   GOALS:  LONG TERM GOALS: Target date: 01/19/23  Ind with a HEP.   Goal status: MET  2.  Stand 30 minutes with pain not > 3-4/10.  Goal status: MET  3.  Sit 30 minutes with pain not > 3-4/10.   Goal status: MET  4.  Perform ADL's with pain not > 3-4/10.  Goal status: MET PLAN:  PT FREQUENCY: 2x/week  PT DURATION: 6 weeks  PLANNED INTERVENTIONS: 97110-Therapeutic exercises, 97530- Therapeutic activity, [id number]- Neuromuscular re-education, 97535- Self Care, 66063- Manual therapy, 97014- Electrical stimulation (unattended), 97035- Ultrasound, Patient/Family education, Dry Needling, Cryotherapy, and Moist heat.  PLAN FOR NEXT SESSION: Combo e'stim/US, STW/M, Core exercise progression, spinal protection techniques and body mechanics training.   Newman Pies, PTA 01/13/2023, 11:15 AM   PHYSICAL THERAPY DISCHARGE SUMMARY  Visits from Start of Care: 10.  Current functional level related to goals / functional outcomes: See above.   Remaining deficits: Goals met.   Education / Equipment: HEP.   Patient agrees to discharge. Patient goals were met. Patient is being discharged due to meeting the stated rehab goals.    [place]aly Applegate MPT

## 2023-01-21 LAB — GENECONNECT MOLECULAR SCREEN: Genetic Analysis Overall Interpretation: NEGATIVE

## 2023-03-16 DIAGNOSIS — R053 Chronic cough: Secondary | ICD-10-CM | POA: Diagnosis not present

## 2023-03-17 DIAGNOSIS — M2560 Stiffness of unspecified joint, not elsewhere classified: Secondary | ICD-10-CM | POA: Diagnosis not present

## 2023-03-17 DIAGNOSIS — M2540 Effusion, unspecified joint: Secondary | ICD-10-CM | POA: Diagnosis not present

## 2023-03-17 DIAGNOSIS — M79641 Pain in right hand: Secondary | ICD-10-CM | POA: Diagnosis not present

## 2023-03-17 DIAGNOSIS — M154 Erosive (osteo)arthritis: Secondary | ICD-10-CM | POA: Diagnosis not present

## 2023-03-17 DIAGNOSIS — M0609 Rheumatoid arthritis without rheumatoid factor, multiple sites: Secondary | ICD-10-CM | POA: Diagnosis not present

## 2023-03-17 DIAGNOSIS — M79642 Pain in left hand: Secondary | ICD-10-CM | POA: Diagnosis not present

## 2023-04-06 DIAGNOSIS — Z7185 Encounter for immunization safety counseling: Secondary | ICD-10-CM | POA: Diagnosis not present

## 2023-04-06 DIAGNOSIS — Z0000 Encounter for general adult medical examination without abnormal findings: Secondary | ICD-10-CM | POA: Diagnosis not present

## 2023-04-06 DIAGNOSIS — M0600 Rheumatoid arthritis without rheumatoid factor, unspecified site: Secondary | ICD-10-CM | POA: Diagnosis not present

## 2023-04-06 DIAGNOSIS — M8580 Other specified disorders of bone density and structure, unspecified site: Secondary | ICD-10-CM | POA: Diagnosis not present

## 2023-05-11 DIAGNOSIS — Z13220 Encounter for screening for lipoid disorders: Secondary | ICD-10-CM | POA: Diagnosis not present

## 2023-05-11 DIAGNOSIS — Z0000 Encounter for general adult medical examination without abnormal findings: Secondary | ICD-10-CM | POA: Diagnosis not present

## 2023-05-11 DIAGNOSIS — Z136 Encounter for screening for cardiovascular disorders: Secondary | ICD-10-CM | POA: Diagnosis not present

## 2023-08-02 DIAGNOSIS — M1991 Primary osteoarthritis, unspecified site: Secondary | ICD-10-CM | POA: Diagnosis not present

## 2023-08-02 DIAGNOSIS — M0609 Rheumatoid arthritis without rheumatoid factor, multiple sites: Secondary | ICD-10-CM | POA: Diagnosis not present

## 2023-08-02 DIAGNOSIS — M2540 Effusion, unspecified joint: Secondary | ICD-10-CM | POA: Diagnosis not present

## 2023-08-02 DIAGNOSIS — M2560 Stiffness of unspecified joint, not elsewhere classified: Secondary | ICD-10-CM | POA: Diagnosis not present

## 2023-08-02 DIAGNOSIS — M154 Erosive (osteo)arthritis: Secondary | ICD-10-CM | POA: Diagnosis not present

## 2023-11-02 DIAGNOSIS — M2560 Stiffness of unspecified joint, not elsewhere classified: Secondary | ICD-10-CM | POA: Diagnosis not present

## 2023-11-02 DIAGNOSIS — M154 Erosive (osteo)arthritis: Secondary | ICD-10-CM | POA: Diagnosis not present

## 2023-11-02 DIAGNOSIS — M2540 Effusion, unspecified joint: Secondary | ICD-10-CM | POA: Diagnosis not present

## 2023-11-02 DIAGNOSIS — M0609 Rheumatoid arthritis without rheumatoid factor, multiple sites: Secondary | ICD-10-CM | POA: Diagnosis not present

## 2023-11-03 DIAGNOSIS — D225 Melanocytic nevi of trunk: Secondary | ICD-10-CM | POA: Diagnosis not present

## 2023-11-03 DIAGNOSIS — Z85820 Personal history of malignant melanoma of skin: Secondary | ICD-10-CM | POA: Diagnosis not present

## 2023-11-03 DIAGNOSIS — D2272 Melanocytic nevi of left lower limb, including hip: Secondary | ICD-10-CM | POA: Diagnosis not present

## 2023-11-03 DIAGNOSIS — L821 Other seborrheic keratosis: Secondary | ICD-10-CM | POA: Diagnosis not present

## 2023-11-03 DIAGNOSIS — D1801 Hemangioma of skin and subcutaneous tissue: Secondary | ICD-10-CM | POA: Diagnosis not present

## 2023-11-03 DIAGNOSIS — D224 Melanocytic nevi of scalp and neck: Secondary | ICD-10-CM | POA: Diagnosis not present

## 2023-11-03 DIAGNOSIS — D2271 Melanocytic nevi of right lower limb, including hip: Secondary | ICD-10-CM | POA: Diagnosis not present

## 2023-11-03 DIAGNOSIS — L814 Other melanin hyperpigmentation: Secondary | ICD-10-CM | POA: Diagnosis not present

## 2023-11-14 DIAGNOSIS — Z79899 Other long term (current) drug therapy: Secondary | ICD-10-CM | POA: Diagnosis not present

## 2023-11-14 DIAGNOSIS — H2513 Age-related nuclear cataract, bilateral: Secondary | ICD-10-CM | POA: Diagnosis not present

## 2023-11-14 DIAGNOSIS — M0689 Other specified rheumatoid arthritis, multiple sites: Secondary | ICD-10-CM | POA: Diagnosis not present

## 2023-11-17 DIAGNOSIS — Z1231 Encounter for screening mammogram for malignant neoplasm of breast: Secondary | ICD-10-CM | POA: Diagnosis not present
